# Patient Record
Sex: Female | Born: 1970 | Race: White | Hispanic: No | Marital: Married | Smoking: Never smoker
Health system: Southern US, Community
[De-identification: ages and names within clinical notes are randomized; demographics above are authoritative.]

## PROBLEM LIST (undated history)

## (undated) DIAGNOSIS — T753XXA Motion sickness, initial encounter: Secondary | ICD-10-CM

## (undated) DIAGNOSIS — I10 Essential (primary) hypertension: Secondary | ICD-10-CM

## (undated) HISTORY — PX: ABDOMINAL SURGERY: SHX537

## (undated) HISTORY — DX: Essential (primary) hypertension: I10

---

## 2005-09-28 DIAGNOSIS — G43909 Migraine, unspecified, not intractable, without status migrainosus: Secondary | ICD-10-CM | POA: Insufficient documentation

## 2008-04-16 ENCOUNTER — Ambulatory Visit: Payer: Self-pay | Admitting: Family Medicine

## 2009-05-10 IMAGING — CR RIGHT FOURTH TOE
1 series · 3 of 3 positions shown · non-contrast
Comparison: none

REASON FOR EXAM: pain s/p injury
COMMENTS:

PROCEDURE:     MDR - MDR TOE 4TH DIGIT RIGHT FOOT  - April 16, 2008 [DATE]
RESULT:     Images of the right fourth toe demonstrate no definite fracture,
dislocation or radiopaque foreign body.

[Series 1: view not recorded · 0.17mm/px · 3 of 3 slices shown]
[im 1/3]
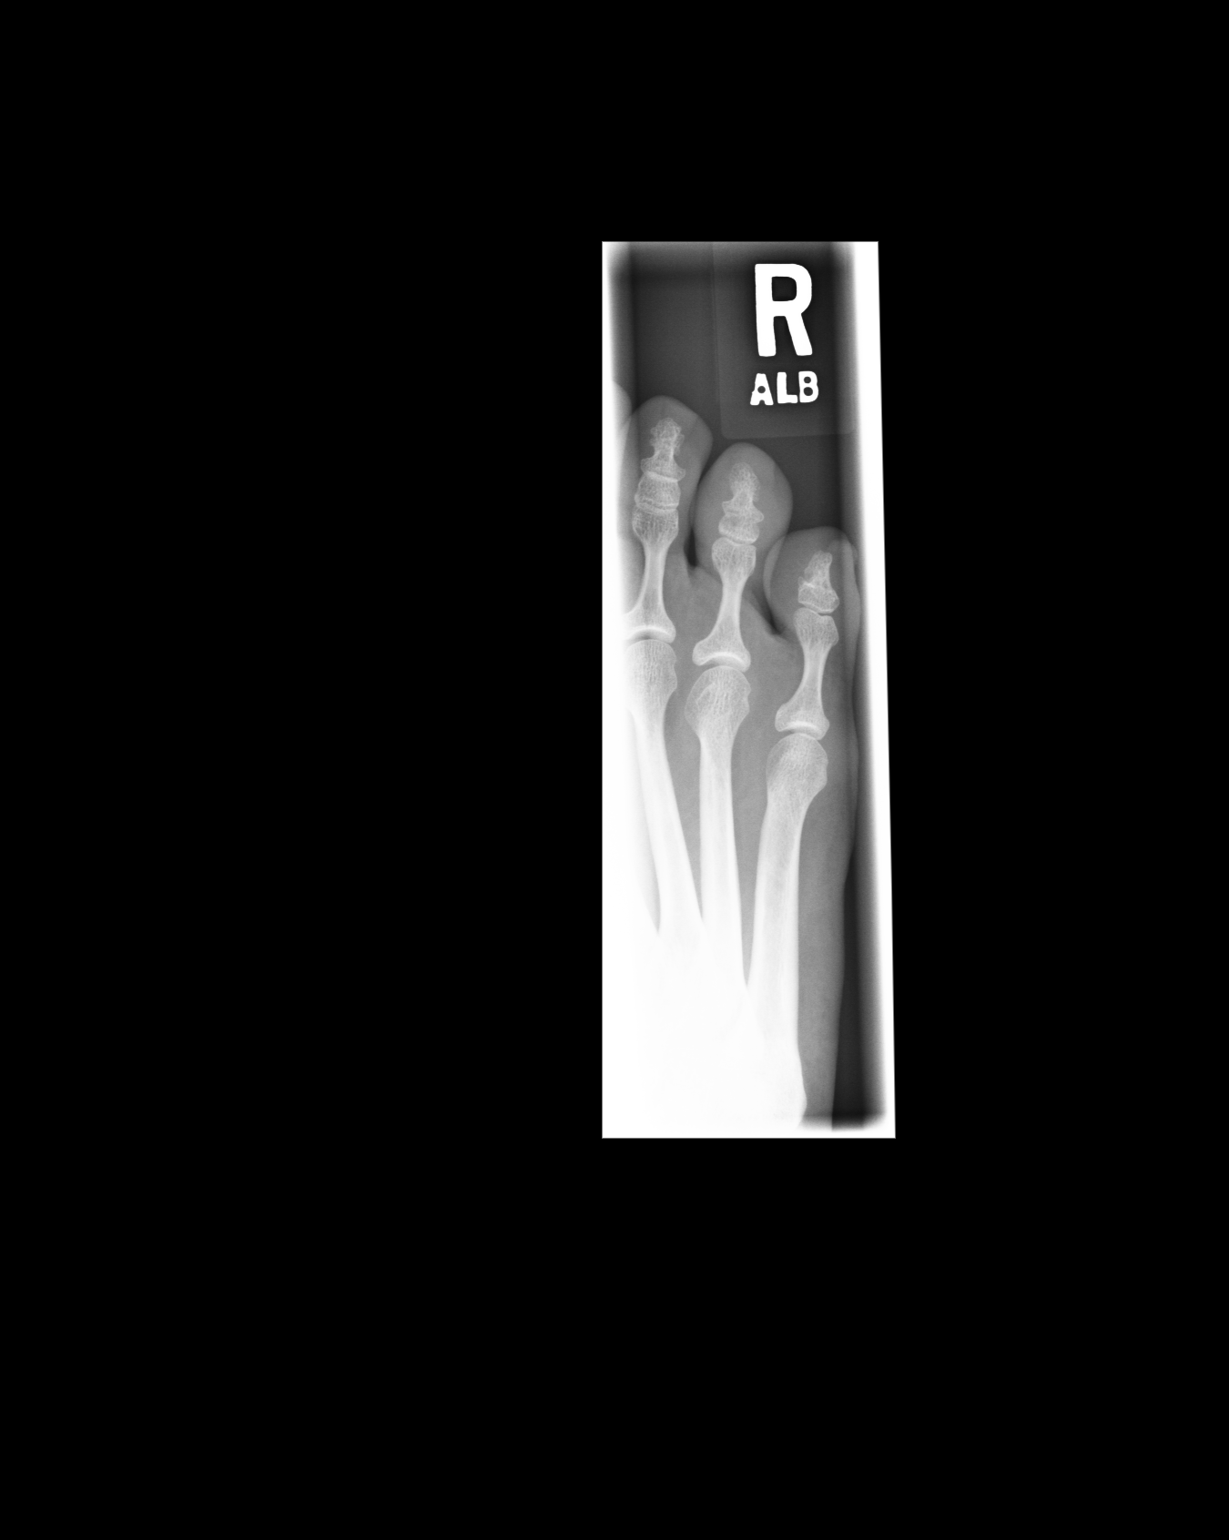
[im 2/3]
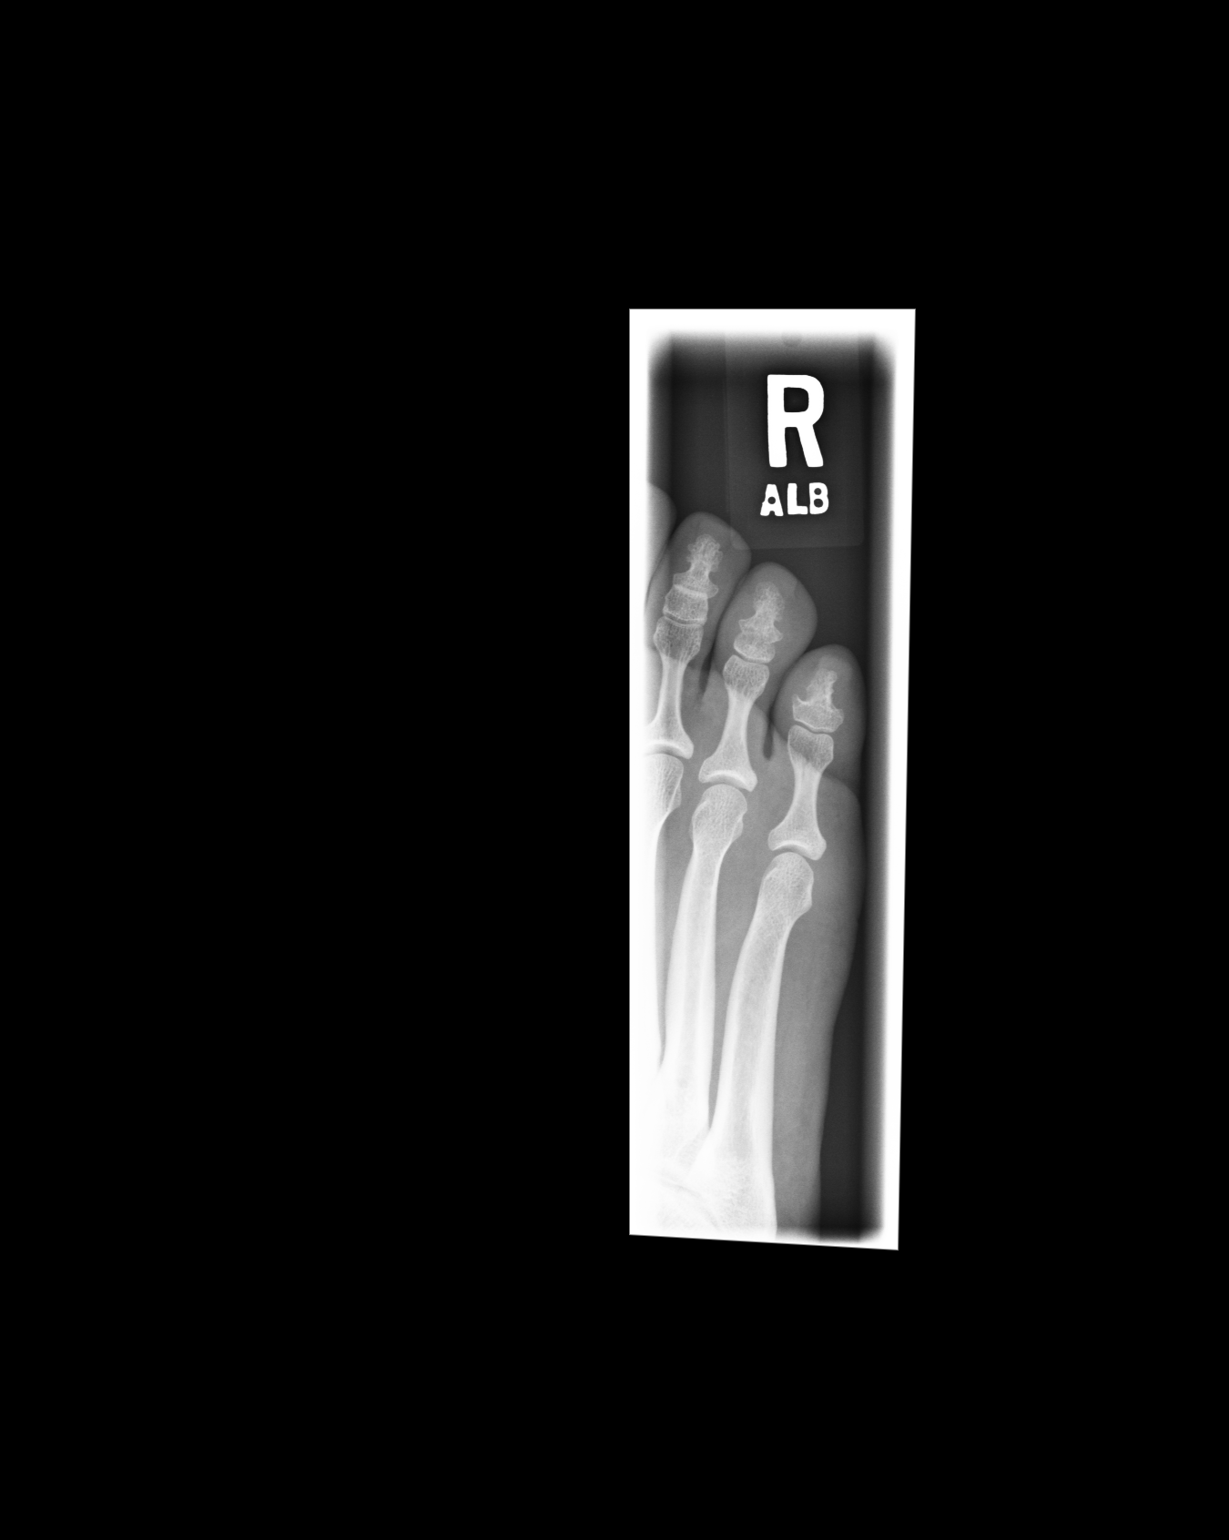
[im 3/3]
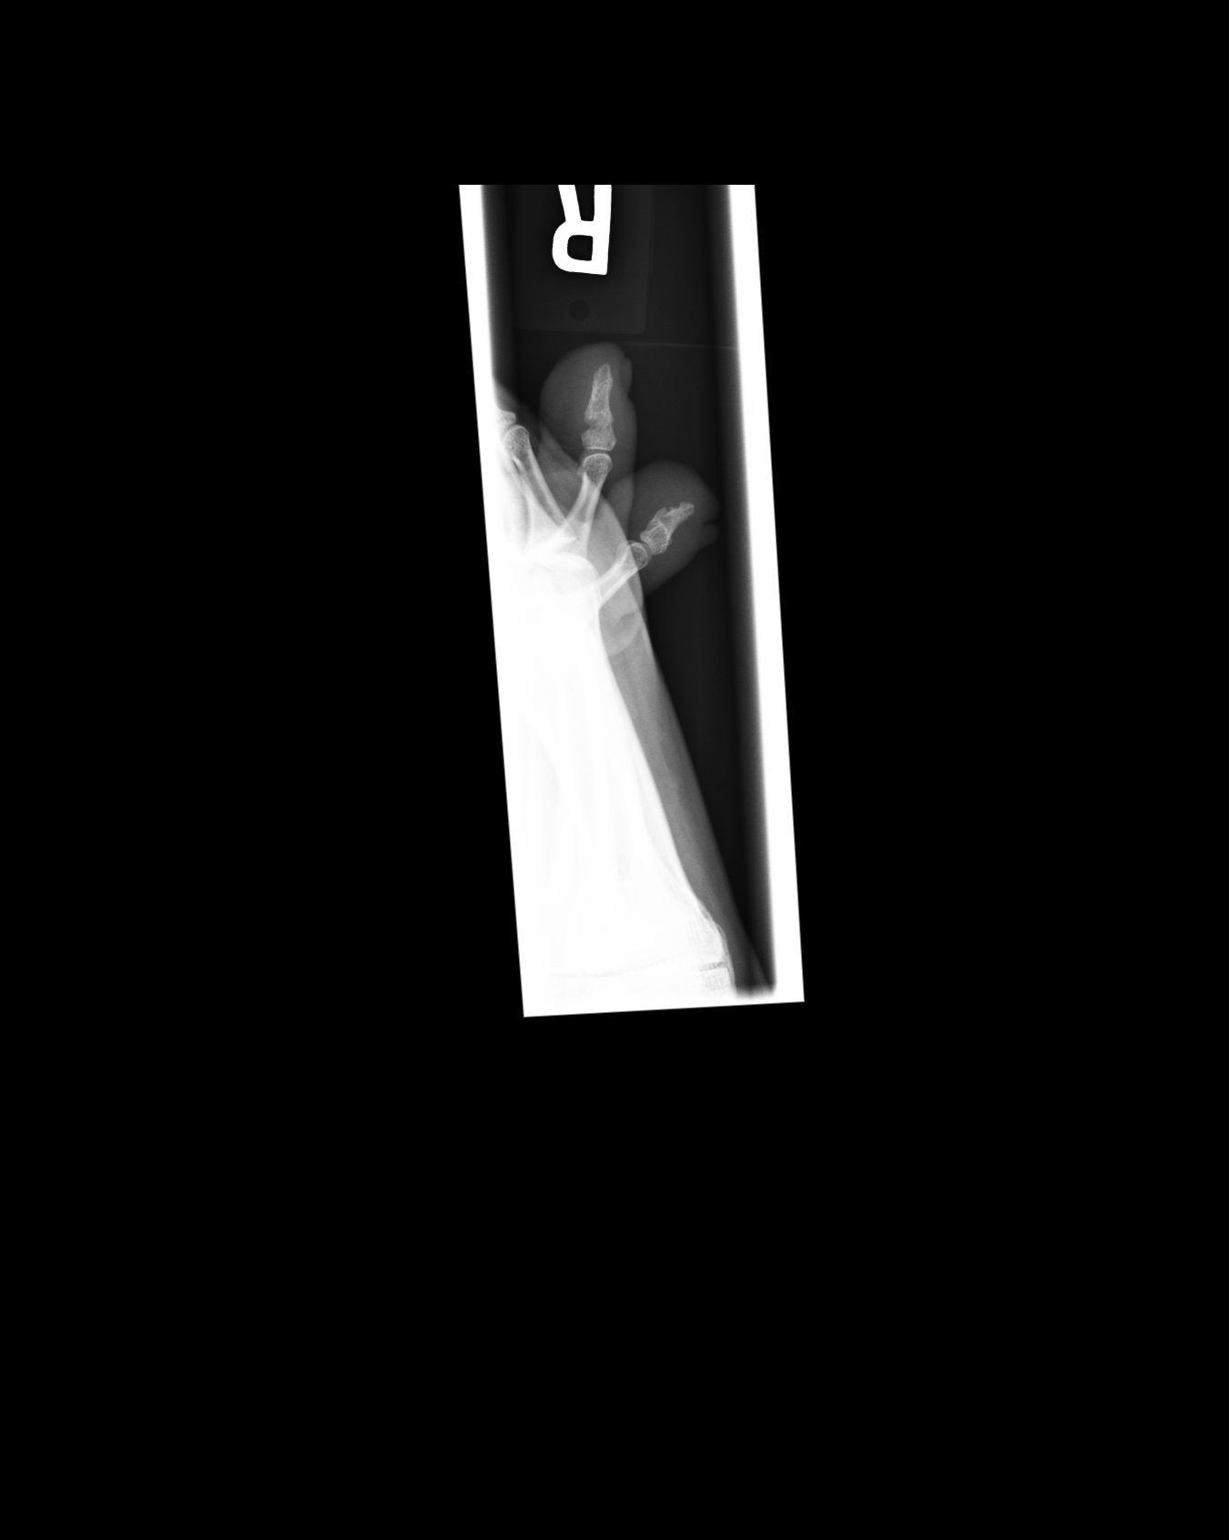

[3 of 3 positions shown; findings below may reference images not displayed]

IMPRESSION: No acute bony abnormality evident. If the patient has
worsening or persistent symptoms, followup imaging could be performed to
evaluate for possible occult fracture.

## 2011-04-02 ENCOUNTER — Ambulatory Visit: Payer: Self-pay | Admitting: Internal Medicine

## 2016-02-26 ENCOUNTER — Ambulatory Visit
Admission: EM | Admit: 2016-02-26 | Discharge: 2016-02-26 | Disposition: A | Discharge status: Home / Self Care | Payer: BC Managed Care – PPO | Attending: Family Medicine | Admitting: Family Medicine

## 2016-02-26 ENCOUNTER — Encounter: Payer: Self-pay | Admitting: Emergency Medicine

## 2016-02-26 DIAGNOSIS — J029 Acute pharyngitis, unspecified: Secondary | ICD-10-CM

## 2016-02-26 LAB — RAPID STREP SCREEN (MED CTR MEBANE ONLY): Streptococcus, Group A Screen (Direct): NEGATIVE

## 2016-02-26 MED ORDER — GUAIFENESIN-CODEINE 100-10 MG/5ML PO SOLN: ORAL | Status: DC

## 2016-02-26 MED ORDER — LIDOCAINE VISCOUS 2 % MT SOLN: OROMUCOSAL | Status: DC

## 2016-02-26 NOTE — Discharge Instructions (Signed)

## 2016-02-26 NOTE — ED Notes (Signed)
Patient c/o sore throat, cough, HAs  since Sunday.  Patient denies fevers.

## 2016-02-27 ENCOUNTER — Ambulatory Visit: Payer: Self-pay | Admitting: Family Medicine

## 2016-03-01 LAB — CULTURE, GROUP A STREP (THRC)

## 2016-03-05 ENCOUNTER — Telehealth (HOSPITAL_COMMUNITY): Payer: Self-pay | Admitting: Emergency Medicine

## 2016-03-05 NOTE — ED Notes (Signed)
LM on pt's VM [phone] Need to give lab results from recent visit on 5/31 Also let pt know labs can be obtained from [place]  Per Dr. Valere Dross,  Notes Recorded by Sherlene Shams, MD on 03/04/2016 at 1:44 AM Clinical staff, please let patient know that throat cx was negative for strep. LM

## 2016-04-17 NOTE — ED Provider Notes (Signed)
CSN: [id number]     Arrival date & time 02/26/16  1340 History   First MD Initiated Contact with Patient 02/26/16 1452     Chief Complaint  Patient presents with  . Sore Throat  . Cough   (Consider location/radiation/quality/duration/timing/severity/associated sxs/prior Treatment) Patient is a 45 y.o. female presenting with pharyngitis and cough. The history is provided by the patient.  Sore Throat This is a new problem. The current episode started more than 2 days ago. The problem occurs constantly. The problem has not changed since onset.Pertinent negatives include no chest pain, no abdominal pain, no headaches and no shortness of breath.  Cough Associated symptoms: no chest pain, no headaches and no shortness of breath     History reviewed. No pertinent past medical history. Past Surgical History  Procedure Laterality Date  . Abdominal surgery     History reviewed. No pertinent family history. Social History  Substance Use Topics  . Smoking status: Never Smoker   . Smokeless tobacco: None  . Alcohol Use: Yes   OB History    No data available     Review of Systems  Respiratory: Positive for cough. Negative for shortness of breath.   Cardiovascular: Negative for chest pain.  Gastrointestinal: Negative for abdominal pain.  Neurological: Negative for headaches.    Allergies  Penicillins  Home Medications   Prior to Admission medications   Medication Sig Start Date End Date Taking? Authorizing Provider  guaiFENesin-codeine 100-10 MG/5ML syrup 10 ml po qhs prn cough 02/26/16   Norval Gable, MD  lidocaine (XYLOCAINE) 2 % solution 20 ml gargle and spit q 6 hours prn sore throat 02/26/16   Norval Gable, MD   Meds Ordered and Administered this Visit  Medications - No data to display  BP 122/89 mmHg  Pulse 61  Temp(Src) 98.8 F (37.1 C) (Oral)  Resp 16  Ht 5\' 7"  (1.702 m)  Wt 145 lb (65.772 kg)  BMI 22.71 kg/m2  SpO2 100%  LMP 02/15/2016 (Exact Date) No data  found.   Physical Exam  Constitutional: She appears well-developed and well-nourished. No distress.  HENT:  Head: Normocephalic and atraumatic.  Right Ear: Tympanic membrane, external ear and ear canal normal.  Left Ear: Tympanic membrane, external ear and ear canal normal.  Nose: Mucosal edema and rhinorrhea present. No nose lacerations, sinus tenderness, nasal deformity, septal deviation or nasal septal hematoma. No epistaxis.  No foreign bodies.  Mouth/Throat: Uvula is midline and mucous membranes are normal. Posterior oropharyngeal erythema present. No oropharyngeal exudate or tonsillar abscesses.  Eyes: Conjunctivae and EOM are normal. Pupils are equal, round, and reactive to light. Right eye exhibits no discharge. Left eye exhibits no discharge. No scleral icterus.  Neck: Normal range of motion. Neck supple. No thyromegaly present.  Cardiovascular: Normal rate, regular rhythm and normal heart sounds.   Pulmonary/Chest: Effort normal and breath sounds normal. No respiratory distress. She has no wheezes. She has no rales.  Lymphadenopathy:    She has no cervical adenopathy.  Skin: She is not diaphoretic.  Nursing note and vitals reviewed.   ED Course  Procedures (including critical care time)  Labs Review Labs Reviewed  RAPID STREP SCREEN (NOT AT [facility])  CULTURE, GROUP A STREP [facility])    Imaging Review No results found.   Visual Acuity Review  Right Eye Distance:   Left Eye Distance:   Bilateral Distance:    Right Eye Near:   Left Eye Near:    Bilateral Near:  MDM   1. Pharyngitis    Discharge Medication List as of 02/26/2016  3:29 PM    START taking these medications   Details  guaiFENesin-codeine 100-10 MG/5ML syrup 10 ml po qhs prn cough, Print    lidocaine (XYLOCAINE) 2 % solution 20 ml gargle and spit q 6 hours prn sore throat, Normal       1. diagnosis reviewed with patient 2. rx as per orders above; reviewed possible side effects,  interactions, risks and benefits  3. Recommend supportive treatment with  4. Follow-up prn if symptoms worsen or don't improve    Norval Gable, MD 04/17/16 2146

## 2018-02-22 ENCOUNTER — Ambulatory Visit
Admission: EM | Admit: 2018-02-22 | Discharge: 2018-02-22 | Disposition: A | Discharge status: Home / Self Care | Payer: BC Managed Care – PPO | Attending: Nurse Practitioner | Admitting: Nurse Practitioner

## 2018-02-22 ENCOUNTER — Other Ambulatory Visit: Payer: Self-pay

## 2018-02-22 DIAGNOSIS — L255 Unspecified contact dermatitis due to plants, except food: Secondary | ICD-10-CM

## 2018-02-22 DIAGNOSIS — L237 Allergic contact dermatitis due to plants, except food: Secondary | ICD-10-CM

## 2018-02-22 MED ORDER — METHYLPREDNISOLONE SODIUM SUCC 40 MG IJ SOLR
80.0000 mg | Freq: Once | INTRAMUSCULAR | Status: AC
  Administered 2018-02-22: 80 mg via INTRAMUSCULAR

## 2018-02-22 MED ORDER — PREDNISONE 20 MG PO TABS: 40.0000 mg | ORAL_TABLET | Freq: Every day | ORAL | 0 refills | Status: AC

## 2018-02-22 NOTE — ED Provider Notes (Signed)
[facility]    CSN: [id number] Arrival date & time: 02/22/18  2355     History   Chief Complaint Chief Complaint  Patient presents with  . Poison Ivy    HPI Rebekah Vasquez is a 47 y.o. female.   History of Present Illness  Patient information was obtained from patient.  Rebekah Vasquez is a 47 y.o. Female that presents for evaluation of rash on abdomen, back, torso and trunk. Onset of symptoms was abrupt starting 4 days ago and has been gradually worsening since that time. Symptoms include itching. Care prior to arrival consisted of OTC ointment and benadryl with minimal relief.        History reviewed. No pertinent past medical history.  There are no active problems to display for this patient.   Past Surgical History:  Procedure Laterality Date  . ABDOMINAL SURGERY      OB History   None      Home Medications    Prior to Admission medications   Medication Sig Start Date End Date Taking? Authorizing Provider  guaiFENesin-codeine 100-10 MG/5ML syrup 10 ml po qhs prn cough 02/26/16   Norval Gable, MD  lidocaine (XYLOCAINE) 2 % solution 20 ml gargle and spit q 6 hours prn sore throat 02/26/16   Norval Gable, MD    Family History History reviewed. No pertinent family history.  Social History Social History   Tobacco Use  . Smoking status: Never Smoker  . Smokeless tobacco: Never Used  Substance Use Topics  . Alcohol use: Yes    Comment: social  . Drug use: Never     Allergies   Penicillins   Review of Systems Review of Systems  Skin: Positive for rash.  All other systems reviewed and are negative.    Physical Exam Triage Vital Signs ED Triage Vitals  Enc Vitals Group     BP 02/22/18 0823 138/85     Pulse Rate 02/22/18 0823 (!) 55     Resp 02/22/18 0823 16     Temp 02/22/18 0823 98.2 F (36.8 C)     Temp Source 02/22/18 0823 Oral     SpO2 02/22/18 0823 100 %     Weight 02/22/18 0824 155 lb (70.3 kg)     Height 02/22/18  0824 5\' 7"  (1.702 m)     Head Circumference --      Peak Flow --      Pain Score 02/22/18 0824 0     Pain Loc --      Pain Edu? --      Excl. in [place]? --    No data found.  Updated Vital Signs BP 138/85 (BP Location: Left Arm)   Pulse (!) 55   Temp 98.2 F (36.8 C) (Oral)   Resp 16   Ht 5\' 7"  (1.702 m)   Wt 155 lb (70.3 kg)   LMP 02/18/2018   SpO2 100%   BMI 24.28 kg/m   Visual Acuity Right Eye Distance:   Left Eye Distance:   Bilateral Distance:    Right Eye Near:   Left Eye Near:    Bilateral Near:     Physical Exam  Constitutional: She is oriented to person, place, and time. She appears well-developed and well-nourished.  Neck: Normal range of motion. Neck supple.  Cardiovascular: Normal rate and regular rhythm.  Pulmonary/Chest: Effort normal and breath sounds normal.  Musculoskeletal: Normal range of motion.  Neurological: She is alert and oriented to person, place,  and time.  Skin: Skin is warm and dry. Rash noted.  Psychiatric: She has a normal mood and affect.     UC Treatments / Results  Labs (all labs ordered are listed, but only abnormal results are displayed) Labs Reviewed - No data to display  EKG None  Radiology No results found.  Procedures Procedures (including critical care time)  Medications Ordered in UC Medications  methylPREDNISolone sodium succinate (SOLU-MEDROL) 40 mg/mL injection 80 mg (has no administration in time range)    Initial Impression / Assessment and Plan / UC Course  I have reviewed the triage vital signs and the nursing notes.  Pertinent labs & imaging results that were available during my care of the patient were reviewed by me and considered in my medical decision making (see chart for details).    47 yo female presenting with a 4-day history of progressively worsening rash secondary to poison ivy/oak.   Plan:  1.  Solu-Medrol 80 mg IM given in clinic 2.  Prednisone 40 mg p.o. daily x5 days 3.  Benadryl 25  mg p.o. every 6 x24 hours, then every 6 hours as needed 4.  Hydrocortisone cream as needed 5.  Avoid products with fragrances until symptoms resolve 6.  Follow up with PCP as needed  Evaluation has revealed no signs of a dangerous process. Discussed diagnosis with patient. Patient aware of their illness, possible red flag symptoms to watch out for and need for close follow up. Patient understands verbal and written discharge instructions. Patient comfortable with plan and disposition.  Patient a clear mental status at this time, good insight into illness (after discussion and teaching) and has clear judgment to make decisions regarding their care.  Documentation was completed with the aid of voice recognition software. Transcription may contain typographical errors.  Final Clinical Impressions(s) / UC Diagnoses   Final diagnoses:  Toxicodendron dermatitis   Discharge Instructions   None    ED Prescriptions    None     Controlled Substance Prescriptions [place] Controlled Substance Registry consulted? Not Applicable   Enrique Sack, [place] 02/22/18 [phone]

## 2018-02-22 NOTE — ED Triage Notes (Signed)
Pt reports poison ivy starting on Saturday. Mostly on trunk and hips but now spreading to arms

## 2018-11-10 ENCOUNTER — Ambulatory Visit: Payer: BC Managed Care – PPO | Admitting: Family Medicine

## 2018-11-10 ENCOUNTER — Encounter: Payer: Self-pay | Admitting: Family Medicine

## 2018-11-10 VITALS — BP 120/86 | HR 65 | Ht 67.0 in | Wt 149.0 lb

## 2018-11-10 DIAGNOSIS — R51 Headache: Secondary | ICD-10-CM

## 2018-11-10 DIAGNOSIS — Z7689 Persons encountering health services in other specified circumstances: Secondary | ICD-10-CM | POA: Diagnosis not present

## 2018-11-10 DIAGNOSIS — R29818 Other symptoms and signs involving the nervous system: Secondary | ICD-10-CM

## 2018-11-10 DIAGNOSIS — R030 Elevated blood-pressure reading, without diagnosis of hypertension: Secondary | ICD-10-CM | POA: Diagnosis not present

## 2018-11-10 DIAGNOSIS — R519 Headache, unspecified: Secondary | ICD-10-CM

## 2018-11-10 DIAGNOSIS — R0789 Other chest pain: Secondary | ICD-10-CM | POA: Diagnosis not present

## 2018-11-10 DIAGNOSIS — N943 Premenstrual tension syndrome: Secondary | ICD-10-CM | POA: Insufficient documentation

## 2018-11-10 NOTE — Progress Notes (Signed)
    Date:  11/10/2018   Name:  Rebekah Vasquez   DOB:  [birth year]   MRN:  [id number]   Chief Complaint: Establish Care and Hypertension (can't take HCTZ)  Hypertension  Associated symptoms include chest pain.  Chest Pain    Her past medical history is significant for hypertension.  Neurologic Problem  Associated symptoms include chest pain.    Review of Systems  Cardiovascular: Positive for chest pain.    Patient Active Problem List   Diagnosis Date Noted  . Premenstrual tension syndrome 11/10/2018  . Migraine 09/28/2005    Allergies  Allergen Reactions  . Penicillins Rash    Past Surgical History:  Procedure Laterality Date  . ABDOMINAL SURGERY      Social History   Tobacco Use  . Smoking status: Never Smoker  . Smokeless tobacco: Never Used  Substance Use Topics  . Alcohol use: Yes    Comment: social  . Drug use: Never     Medication list has been reviewed and updated.  Current Meds  Medication Sig  . ESTARYLLA 0.25-35 MG-MCG tablet TK 1 T PO QD  . FLUoxetine (PROZAC) 20 MG tablet Take 1 tablet by mouth daily.  . Ibuprofen (ADVIL) 200 MG CAPS Take 1 capsule by mouth as needed.    PHQ 2/9 Scores 11/10/2018  PHQ - 2 Score 0  PHQ- 9 Score 0    Physical Exam  BP 120/86   Pulse 65   Ht 5\' 7"  (1.702 m)   Wt 149 lb (67.6 kg)   SpO2 99%   BMI 23.34 kg/m   Assessment and Plan: 1. Establishing care with new doctor, encounter for Patient presents today to establish care with new physician.  2. Atypical chest pain Patient was seen in [facility] emergency room for chest pain.  This was atypical in nature and evaluation for pulmonary emboli and acute coronary event was ruled out.  Patient although was continuing to have chest discomfort with a heaviness to the right arm.  We will refer to cardiology for evaluation and possible stress test. - Ambulatory referral to Cardiology  3. Elevated blood pressure reading without diagnosis of hypertension Patient has  had waxing and waning of her blood pressure with elevated readings.  There is a family history of this she is reluctant to continue her hydrochlorothiazide because she does not feel well on it.  Reemphasized the importance of control of blood pressure and will have cardiology evaluate with possible resumption of some patient medication does have a history of migraines and is wondering whether or not use calcium channel blocker such as verapamil. - Ambulatory referral to Cardiology  4. Increased severity of headaches Patient has had an increase in headaches which she had a history of migraines.  There has been some concern because there are some other neurologic events associated with it such as slurring of speech and visual walk disturbance and right arm paresthesia.  Will refer to neurology for evaluation and treatment. - Ambulatory referral to Neurology  5. Focal neurological signs As noted above there is some focality associated with these headaches and this will be evaluated at the neurologic level. - Ambulatory referral to Neurology

## 2018-11-11 ENCOUNTER — Encounter

## 2018-11-11 DIAGNOSIS — I2089 Other forms of angina pectoris: Secondary | ICD-10-CM | POA: Insufficient documentation

## 2018-11-11 DIAGNOSIS — I208 Other forms of angina pectoris: Secondary | ICD-10-CM | POA: Insufficient documentation

## 2018-11-11 DIAGNOSIS — I10 Essential (primary) hypertension: Secondary | ICD-10-CM | POA: Insufficient documentation

## 2020-05-07 ENCOUNTER — Ambulatory Visit: Payer: Self-pay | Admitting: Family Medicine

## 2020-05-07 NOTE — Telephone Encounter (Signed)
noted 

## 2020-05-07 NOTE — Telephone Encounter (Signed)
Pt reports recent dental visit "BP was 190/100 at visit." Does not routinely check BP at home, did so today, prior to call 170/98, this AM 170/110.  BP taken with manual cuff at home. Reports has tried propranolol and HCTZ for hypertension, "Cannot tolerate" Is currently not taking any BP meds. States does feel "A little weird  in my chest., like anxiety." Denies CP, no SOB, no unilateral weakness, speech clear and non-halting.  Reports increased fatigue. Appt made for tomorrow with Dr. Ronnald Ramp. Care advise given per protocol.  Pt verbalizes understanding.    Reason for Disposition . Systolic BP  >= 785 OR Diastolic >= 885  Answer Assessment - Initial Assessment Questions 1. BLOOD PRESSURE: "What is the blood pressure?" "Did you take at least two measurements 5 minutes apart?"     170/98 few minutes ago. This AM 190/100   170/110 2. ONSET: "When did you take your blood pressure?"     Today 3. HOW: "How did you obtain the blood pressure?" (e.g., visiting nurse, automatic home BP monitor)     Manuel cuff at home 4. HISTORY: "Do you have a history of high blood pressure?"     yes 5. MEDICATIONS: "Are you taking any medications for blood pressure?" "Have you missed any doses recently?" Stopped taking HCTZ    6. OTHER SYMPTOMS: "Do you have any symptoms?" (e.g., headache, chest pain, blurred vision, difficulty breathing, weakness)     "Chest feels like anxiety tight."  Protocols used: BLOOD PRESSURE - HIGH-A-AH

## 2020-05-08 ENCOUNTER — Ambulatory Visit: Payer: BC Managed Care – PPO | Admitting: Family Medicine

## 2020-05-08 ENCOUNTER — Other Ambulatory Visit: Payer: Self-pay

## 2020-05-08 ENCOUNTER — Encounter: Payer: Self-pay | Admitting: Family Medicine

## 2020-05-08 VITALS — BP 160/98 | HR 66 | Ht 67.0 in | Wt 158.0 lb

## 2020-05-08 DIAGNOSIS — I10 Essential (primary) hypertension: Secondary | ICD-10-CM | POA: Diagnosis not present

## 2020-05-08 DIAGNOSIS — R079 Chest pain, unspecified: Secondary | ICD-10-CM

## 2020-05-08 DIAGNOSIS — R202 Paresthesia of skin: Secondary | ICD-10-CM | POA: Diagnosis not present

## 2020-05-08 MED ORDER — METOPROLOL SUCCINATE ER 25 MG PO TB24: 25.0000 mg | ORAL_TABLET | Freq: Every day | ORAL | 0 refills | Status: DC

## 2020-05-08 NOTE — Progress Notes (Signed)
Date:  05/08/2020   Name:  Rebekah Vasquez   DOB:  March 18, 1971   MRN:  [id number]   Chief Complaint: Hypertension (BP trending up at home per patient. She takes it manually and automatic. Highest 190/110. Lowest this morning was 162/90. Started having tingling in the right shoulder blade down into right arm last night. Has angina on and off- but she does have anxiety. Mother and sister both have HTN. )  Hypertension This is a chronic problem. The current episode started more than 1 year ago. The problem has been gradually worsening since onset. The problem is uncontrolled. Pertinent negatives include no anxiety, blurred vision, chest pain, headaches, malaise/fatigue, neck pain, orthopnea, palpitations, peripheral edema, PND, shortness of breath or sweats. There are no associated agents to hypertension. Past treatments include nothing (prior hctz/propranolol). The current treatment provides mild improvement. There are no compliance problems.  There is no history of angina, kidney disease, CAD/MI, CVA, heart failure, left ventricular hypertrophy, PVD or retinopathy. history stable angina. There is no history of chronic renal disease, a hypertension causing med or renovascular disease.  Chest Pain  This is a recurrent problem. The current episode started more than 1 year ago. The problem occurs daily. The problem has been waxing and waning. The pain is present in the substernal region. The pain is at a severity of 6/10. The pain is moderate. The quality of the pain is described as tightness. The pain radiates to the upper back and right shoulder. Pertinent negatives include no abdominal pain, cough, headaches, lower extremity edema, malaise/fatigue, near-syncope, orthopnea, palpitations, PND, shortness of breath or weakness.  Her past medical history is significant for hypertension.  Pertinent negatives for past medical history include no PVD.  Her family medical history is significant for CAD.  Neurologic  Problem The patient's pertinent negatives include no altered mental status, clumsiness, focal sensory loss, focal weakness, loss of balance, memory loss, near-syncope, slurred speech, syncope, visual change or weakness. Primary symptoms comment: paresthesia. This is a recurrent problem. The current episode started yesterday. The problem has been waxing and waning since onset. Pertinent negatives include no abdominal pain, auditory change, chest pain, headaches, neck pain, palpitations or shortness of breath. The treatment provided moderate relief.    No results found for: CREATININE, BUN, NA, K, CL, CO2 No results found for: CHOL, HDL, LDLCALC, LDLDIRECT, TRIG, CHOLHDL No results found for: TSH No results found for: HGBA1C No results found for: WBC, HGB, HCT, MCV, PLT No results found for: ALT, AST, GGT, ALKPHOS, BILITOT   Review of Systems  Constitutional: Negative for malaise/fatigue.  Eyes: Negative for blurred vision.  Respiratory: Negative for cough and shortness of breath.   Cardiovascular: Negative for chest pain, palpitations, orthopnea, PND and near-syncope.  Gastrointestinal: Negative for abdominal pain.  Musculoskeletal: Negative for neck pain.  Neurological: Negative for focal weakness, syncope, weakness, headaches and loss of balance.  Psychiatric/Behavioral: Negative for memory loss.    Patient Active Problem List   Diagnosis Date Noted  . Premenstrual tension syndrome 11/10/2018  . Migraine 09/28/2005    Allergies  Allergen Reactions  . Penicillins Rash    Past Surgical History:  Procedure Laterality Date  . ABDOMINAL SURGERY      Social History   Tobacco Use  . Smoking status: Never Smoker  . Smokeless tobacco: Never Used  Vaping Use  . Vaping Use: Never used  Substance Use Topics  . Alcohol use: Yes    Comment: social  . Drug  use: Never     Medication list has been reviewed and updated.  Current Meds  Medication Sig  . FLUoxetine (PROZAC) 20  MG tablet Take 1 tablet by mouth as needed.   . Ibuprofen (ADVIL) 200 MG CAPS Take 1 capsule by mouth as needed.  Donnetta Hail Estradiol (SPRINTEC 28 PO) Take by mouth. BRAND SPRINTEC ONLY    PHQ 2/9 Scores 05/08/2020 11/10/2018  PHQ - 2 Score 0 0  PHQ- 9 Score 3 0    GAD 7 : Generalized Anxiety Score 05/08/2020  Nervous, Anxious, on Edge 1  Control/stop worrying 0  Worry too much - different things 0  Trouble relaxing 0  Restless 0  Easily annoyed or irritable 1  Afraid - awful might happen 0  Total GAD 7 Score 2  Anxiety Difficulty Not difficult at all    BP Readings from Last 3 Encounters:  05/08/20 (!) 160/98  11/10/18 120/86  02/22/18 138/85    Physical Exam  Wt Readings from Last 3 Encounters:  05/08/20 158 lb (71.7 kg)  11/10/18 149 lb (67.6 kg)  02/22/18 155 lb (70.3 kg)    BP (!) 160/98   Pulse 66   Ht 5' 7" (1.702 m)   Wt 158 lb (71.7 kg)   LMP 04/14/2020 (Exact Date)   SpO2 100%   BMI 24.75 kg/m   Assessment and Plan:                                       Patient returns today after about a year for which that she has noted an increasing of her blood pressure.  She was seen by Dr. Earnie Larsson roughly a year ago.  Patient did not return for what sounds like a stress test and decided not to continue either of the hydrochlorothiazide that I started nor the propranolol that was initiated by Dr. Nehemiah Massed. 1. Essential hypertension Chronic.  Persistent.  Uncontrolled.  Patient returns today with elevated reading and a history of elevated readings by her daughter.  She is also been having some symptoms suggesting angina that she was seen before as well as some paresthesias when his arm without involvement of the lower extremity or face.  Patient was restarted on a beta-blocker but this time it was Toprol-XL 25 mg once a day and will recheck in 1 week to evaluate for tolerance of medication and results of blood pressure.  We also referred to cardiology for resumption  of her stable angina work-up.  And lipid panel was obtained to see if this will be also a risk factor that we will need to contemplate treating. - Ambulatory referral to Cardiology - Lipid Panel With LDL/HDL Ratio  2. Chest pain at rest Chronic.  Occurred over a year.  But recently been having substernal chest tightness radiating to the back and right shoulder.  Because of her uncontrolled blood pressure patient returns today for evaluation.  EKG was performed with the following results.I have reviewed EKG which shows rate 56 regular intervals normal.  No LVH criteria met for EKG.  No ischemic changes such as R waves delay, Q waves.  Nor ST-T wave changes.  Other than mild bradycardia patient was unremarkable.  Patient was referred again to Dr. Jose Persia to once again resume her work-up for stable angina.. Comparison to previous EKG dated none for comparison - EKG 12-Lead - Ambulatory referral to Cardiology  3. Paresthesia  New onset.  Episodic.  Stable.  Patient has had 1 or 2 episodes of paresthesias involving her left arm but not facial nor lower extremity.  There has been no focal weakness.  Cranial nerve and neurologic exam was unremarkable.  I think this may be more of a radiculopathy involving the cervical region.  We will continue to monitor this and will recheck patient in 1 week to see if this is progressed in any way.

## 2020-05-15 ENCOUNTER — Ambulatory Visit: Payer: BC Managed Care – PPO | Admitting: Family Medicine

## 2020-05-16 ENCOUNTER — Other Ambulatory Visit: Payer: Self-pay

## 2020-05-16 ENCOUNTER — Ambulatory Visit: Payer: BC Managed Care – PPO | Admitting: Family Medicine

## 2020-05-16 ENCOUNTER — Encounter: Payer: Self-pay | Admitting: Family Medicine

## 2020-05-16 VITALS — BP 168/100 | HR 59 | Ht 67.0 in | Wt 154.0 lb

## 2020-05-16 DIAGNOSIS — I10 Essential (primary) hypertension: Secondary | ICD-10-CM | POA: Diagnosis not present

## 2020-05-16 MED ORDER — METOPROLOL SUCCINATE ER 50 MG PO TB24: 50.0000 mg | ORAL_TABLET | Freq: Every day | ORAL | 0 refills | Status: DC

## 2020-05-16 NOTE — Patient Instructions (Signed)

## 2020-05-16 NOTE — Progress Notes (Signed)
Date:  05/16/2020   Name:  Rebekah Vasquez   DOB:  1971-09-03   MRN:  [id number]   Chief Complaint: Hypertension (Follow up with med change- metoprolol.)  Hypertension This is a new problem. The current episode started 1 to 4 weeks ago. The problem is unchanged. The problem is uncontrolled. Pertinent negatives include no anxiety, blurred vision, chest pain, headaches, malaise/fatigue, neck pain, orthopnea, palpitations, peripheral edema, PND, shortness of breath or sweats. Agents associated with hypertension include oral contraceptives. Past treatments include beta blockers. The current treatment provides no improvement. There is no history of angina, kidney disease, CAD/MI, CVA, heart failure, left ventricular hypertrophy, PVD or retinopathy. There is no history of chronic renal disease, a hypertension causing med or renovascular disease.    No results found for: CREATININE, BUN, NA, K, CL, CO2 No results found for: CHOL, HDL, LDLCALC, LDLDIRECT, TRIG, CHOLHDL No results found for: TSH No results found for: HGBA1C No results found for: WBC, HGB, HCT, MCV, PLT No results found for: ALT, AST, GGT, ALKPHOS, BILITOT   Review of Systems  Constitutional: Negative for chills, fever and malaise/fatigue.  HENT: Negative for drooling, ear discharge, ear pain and sore throat.   Eyes: Negative for blurred vision.  Respiratory: Negative for cough, shortness of breath and wheezing.   Cardiovascular: Negative for chest pain, palpitations, orthopnea, leg swelling and PND.  Gastrointestinal: Negative for abdominal pain, blood in stool, constipation, diarrhea and nausea.  Endocrine: Negative for polydipsia.  Genitourinary: Negative for dysuria, frequency, hematuria and urgency.  Musculoskeletal: Negative for back pain, myalgias and neck pain.  Skin: Negative for rash.  Allergic/Immunologic: Negative for environmental allergies.  Neurological: Negative for dizziness and headaches.  Hematological: Does  not bruise/bleed easily.  Psychiatric/Behavioral: Negative for suicidal ideas. The patient is not nervous/anxious.     Patient Active Problem List   Diagnosis Date Noted  . Benign essential HTN 11/11/2018  . Stable angina pectoris ([place]) 11/11/2018  . Premenstrual tension syndrome 11/10/2018  . Migraine 09/28/2005    Allergies  Allergen Reactions  . Penicillins Rash    Past Surgical History:  Procedure Laterality Date  . ABDOMINAL SURGERY      Social History   Tobacco Use  . Smoking status: Never Smoker  . Smokeless tobacco: Never Used  Vaping Use  . Vaping Use: Never used  Substance Use Topics  . Alcohol use: Yes    Comment: social  . Drug use: Never     Medication list has been reviewed and updated.  Current Meds  Medication Sig  . FLUoxetine (PROZAC) 20 MG tablet Take 1 tablet by mouth as needed.   . Ibuprofen (ADVIL) 200 MG CAPS Take 1 capsule by mouth as needed.  . metoprolol succinate (TOPROL-XL) 25 MG 24 hr tablet Take 1 tablet (25 mg total) by mouth daily.  Donnetta Hail Estradiol (SPRINTEC 28 PO) Take by mouth. BRAND SPRINTEC ONLY    PHQ 2/9 Scores 05/08/2020 11/10/2018  PHQ - 2 Score 0 0  PHQ- 9 Score 3 0    GAD 7 : Generalized Anxiety Score 05/08/2020  Nervous, Anxious, on Edge 1  Control/stop worrying 0  Worry too much - different things 0  Trouble relaxing 0  Restless 0  Easily annoyed or irritable 1  Afraid - awful might happen 0  Total GAD 7 Score 2  Anxiety Difficulty Not difficult at all    BP Readings from Last 3 Encounters:  05/16/20 (!) 168/100  05/08/20 (!) 160/98  11/10/18 120/86    Physical Exam Vitals and nursing note reviewed.  Constitutional:      Appearance: She is well-developed.  HENT:     Head: Normocephalic.     Right Ear: External ear normal.     Left Ear: External ear normal.  Eyes:     General: Lids are everted, no foreign bodies appreciated. No scleral icterus.       Left eye: No foreign body or  hordeolum.     Conjunctiva/sclera: Conjunctivae normal.     Right eye: Right conjunctiva is not injected.     Left eye: Left conjunctiva is not injected.     Pupils: Pupils are equal, round, and reactive to light.  Neck:     Thyroid: No thyromegaly.     Vascular: No JVD.     Trachea: No tracheal deviation.  Cardiovascular:     Rate and Rhythm: Normal rate and regular rhythm.     Heart sounds: Normal heart sounds. No murmur heard.  No friction rub. No gallop.   Pulmonary:     Effort: Pulmonary effort is normal. No respiratory distress.     Breath sounds: Normal breath sounds. No wheezing or rales.  Abdominal:     General: Bowel sounds are normal.     Palpations: Abdomen is soft. There is no mass.     Tenderness: There is no abdominal tenderness. There is no guarding or rebound.  Musculoskeletal:        General: No tenderness. Normal range of motion.     Cervical back: Normal range of motion and neck supple.  Lymphadenopathy:     Cervical: No cervical adenopathy.  Skin:    General: Skin is warm.     Findings: No rash.  Neurological:     Mental Status: She is alert and oriented to person, place, and time.     Cranial Nerves: No cranial nerve deficit.     Deep Tendon Reflexes: Reflexes normal.  Psychiatric:        Mood and Affect: Mood is not anxious or depressed.     Wt Readings from Last 3 Encounters:  05/16/20 154 lb (69.9 kg)  05/08/20 158 lb (71.7 kg)  11/10/18 149 lb (67.6 kg)    BP (!) 168/100   Pulse (!) 59   Ht 5\' 7"  (1.702 m)   Wt 154 lb (69.9 kg)   SpO2 99%   BMI 24.12 kg/m   Assessment and Plan: 1. Essential hypertension New onset.  Uncontrolled.  Asymptomatic.  Patient continues to have elevation of her blood pressure on the low dosage of metoprolol of 25 mg XL.  This was selected low purposely because patient has a long history of medication intolerance.  Because of her history of migraines we used a beta-blocker which may also help in reducing the  incidence of migraines.  Patient has tolerated the 25 mg XL but has not had an appreciable decrease in her blood pressure.  Today she will take a second 25 mg XL and see how she tolerates it and if she is done well with this double dosing she has been given a printed prescription for 50 XL to begin tomorrow.  Otherwise she will call if there is an issue and we will continue just with the 25 to she can see cardiology for determinations. - metoprolol succinate (TOPROL-XL) 50 MG 24 hr tablet; Take 1 tablet (50 mg total) by mouth daily. Take with or immediately following a meal.  Dispense: 30 tablet;  Refill: 0

## 2020-05-17 LAB — LIPID PANEL WITH LDL/HDL RATIO
Cholesterol, Total: 212 mg/dL — ABNORMAL HIGH (ref 100–199)
HDL: 61 mg/dL (ref >39)
LDL Chol Calc (NIH): 132 mg/dL — ABNORMAL HIGH (ref 0–99)
LDL/HDL Ratio: 2.2 ratio (ref 0.0–3.2)
Triglycerides: 109 mg/dL (ref 0–149)
VLDL Cholesterol Cal: 19 mg/dL (ref 5–40)

## 2020-06-08 ENCOUNTER — Other Ambulatory Visit: Payer: Self-pay | Admitting: Family Medicine

## 2020-06-08 DIAGNOSIS — I10 Essential (primary) hypertension: Secondary | ICD-10-CM

## 2020-06-08 NOTE — Telephone Encounter (Signed)
Requested Prescriptions  Pending Prescriptions Disp Refills   metoprolol succinate (TOPROL-XL) 50 MG 24 hr tablet [Pharmacy Med Name: METOPROLOL SUCC ER 50 MG TAB] 90 tablet 1    Sig: TAKE 1 TABLET BY MOUTH DAILY WITH OR IMMEDIATELY FOLLOWING A MEAL     Cardiovascular:  Beta Blockers Failed - 06/08/2020  9:32 AM      Failed - Last BP in normal range    BP Readings from Last 1 Encounters:  05/16/20 (!) 168/100         Passed - Last Heart Rate in normal range    Pulse Readings from Last 1 Encounters:  05/16/20 (!) 59         Passed - Valid encounter within last 6 months    Recent Outpatient Visits          3 weeks ago Essential hypertension   [place], [place]   1 month ago Essential hypertension   [facility] Juline Patch, MD   1 year ago Establishing care with new doctor, encounter for   [facility] Juline Patch, MD

## 2020-07-16 ENCOUNTER — Ambulatory Visit: Payer: Self-pay | Admitting: *Deleted

## 2020-07-16 NOTE — Telephone Encounter (Signed)
Patient calling to request appt due to more frequent migraine headaches. No symptoms of headache at this time. Mild vertigo this am. Last headache yesterday. Noticed aura and then blurred vision, speech was affected and became confused or felt "weird" approx. 1-2 hours and symptoms went away. Denies numbness or weakness, nausea or vomiting. Reports migraines or headache started 16 years ago, then every other year, this year in June with speech affected, September, vertigo, speech, and confusion. Yesterday aura, blurred vision, garbled speech, thought process affected and vertigo. appt made for 07/18/20 as requested. Instructed patient to call 911 or go to ED if any symptoms are noted again. Patient reports she now has B/P regulated. No value given. Encouraged patient to keep record of B/P and episodes of headaches and symptoms. Care advise given. Patient verbalized understanding of care advise and to call back or go to ED or call 911.    Reason for Disposition . Headache is a chronic symptom (recurrent or ongoing AND present > 4 weeks)  Answer Assessment - Initial Assessment Questions 1. LOCATION: "Where does it hurt?"      head 2. ONSET: "When did the headache start?" (Minutes, hours or days)      Migraines started over 16 years ago . No headache at this time  3. PATTERN: "Does the pain come and go, or has it been constant since it started?"     Comes and goes . Started 16 years ago , then every other year, and this year noted in June and September, and yesterday  4. SEVERITY: "How bad is the pain?" and "What does it keep you from doing?"  (e.g., Scale 1-10; mild, moderate, or severe)   - MILD (1-3): doesn't interfere with normal activities    - MODERATE (4-7): interferes with normal activities or awakens from sleep    - SEVERE (8-10): excruciating pain, unable to do any normal activities        severe 5. RECURRENT SYMPTOM: "Have you ever had headaches before?" If Yes, ask: "When was the last  time?" and "What happened that time?"      Yes . Yesterday had to lay down in quiet and dark place  6. CAUSE: "What do you think is causing the headache?"     Not sure  7. MIGRAINE: "Have you been diagnosed with migraine headaches?" If Yes, ask: "Is this headache similar?"      Yes  8. HEAD INJURY: "Has there been any recent injury to the head?"      na 9. OTHER SYMPTOMS: "Do you have any other symptoms?" (fever, stiff neck, eye pain, sore throat, cold symptoms)     Aura ,blurred vision, confusion, speech is affected  10. PREGNANCY: "Is there any chance you are pregnant?" "When was your last menstrual period?"       na  Protocols used: HEADACHE-A-AH

## 2020-07-18 ENCOUNTER — Ambulatory Visit: Payer: BC Managed Care – PPO | Admitting: Family Medicine

## 2020-12-30 ENCOUNTER — Other Ambulatory Visit: Payer: Self-pay | Admitting: Family Medicine

## 2020-12-30 DIAGNOSIS — I10 Essential (primary) hypertension: Secondary | ICD-10-CM

## 2021-01-02 ENCOUNTER — Encounter: Payer: Self-pay | Admitting: Family Medicine

## 2021-01-02 ENCOUNTER — Other Ambulatory Visit: Payer: Self-pay

## 2021-01-02 ENCOUNTER — Ambulatory Visit: Payer: BC Managed Care – PPO | Admitting: Family Medicine

## 2021-01-02 VITALS — BP 122/80 | HR 80 | Ht 67.0 in | Wt 161.0 lb

## 2021-01-02 DIAGNOSIS — I10 Essential (primary) hypertension: Secondary | ICD-10-CM

## 2021-01-02 DIAGNOSIS — B351 Tinea unguium: Secondary | ICD-10-CM

## 2021-01-02 MED ORDER — AMLODIPINE BESYLATE 5 MG PO TABS: 1.0000 | ORAL_TABLET | Freq: Every day | ORAL | 11 refills | Status: DC

## 2021-01-02 MED ORDER — METOPROLOL SUCCINATE ER 25 MG PO TB24: 25.0000 mg | ORAL_TABLET | Freq: Every day | ORAL | 1 refills | Status: DC

## 2021-01-02 NOTE — Progress Notes (Signed)
Date:  01/02/2021   Name:  Rebekah Vasquez   DOB:  [birth year]   MRN:  [id number]   Chief Complaint: Hypertension and Nail Problem (Wants referral)  Hypertension This is a chronic problem. The current episode started more than 1 year ago. The problem has been gradually improving since onset. The problem is controlled. Pertinent negatives include no anxiety, blurred vision, chest pain, headaches, malaise/fatigue, neck pain, orthopnea, palpitations, peripheral edema, PND, shortness of breath or sweats. There are no associated agents to hypertension. Past treatments include calcium channel blockers and beta blockers. The current treatment provides moderate improvement. There are no compliance problems.  There is no history of angina, kidney disease, CAD/MI, CVA, heart failure, left ventricular hypertrophy, PVD or retinopathy. There is no history of chronic renal disease, a hypertension causing med or renovascular disease.    No results found for: CREATININE, BUN, NA, K, CL, CO2 Lab Results  Component Value Date   CHOL 212 (H) 05/16/2020   HDL 61 05/16/2020   LDLCALC 132 (H) 05/16/2020   TRIG 109 05/16/2020   No results found for: TSH No results found for: HGBA1C No results found for: WBC, HGB, HCT, MCV, PLT No results found for: ALT, AST, GGT, ALKPHOS, BILITOT   Review of Systems  Constitutional: Negative.  Negative for chills, fatigue, fever, malaise/fatigue and unexpected weight change.  HENT: Negative for congestion, ear discharge, ear pain, rhinorrhea, sinus pressure, sneezing and sore throat.   Eyes: Negative for blurred vision, photophobia, pain, discharge, redness and itching.  Respiratory: Negative for cough, shortness of breath, wheezing and stridor.   Cardiovascular: Negative for chest pain, palpitations, orthopnea and PND.  Gastrointestinal: Negative for abdominal pain, blood in stool, constipation, diarrhea, nausea and vomiting.  Endocrine: Negative for cold intolerance, heat  intolerance, polydipsia, polyphagia and polyuria.  Genitourinary: Negative for dysuria, flank pain, frequency, hematuria, menstrual problem, pelvic pain, urgency, vaginal bleeding and vaginal discharge.  Musculoskeletal: Negative for arthralgias, back pain, myalgias and neck pain.  Skin: Negative for rash.  Allergic/Immunologic: Negative for environmental allergies and food allergies.  Neurological: Negative for dizziness, weakness, light-headedness, numbness and headaches.  Hematological: Negative for adenopathy. Does not bruise/bleed easily.  Psychiatric/Behavioral: Negative for dysphoric mood. The patient is not nervous/anxious.     Patient Active Problem List   Diagnosis Date Noted  . Benign essential HTN 11/11/2018  . Stable angina pectoris ([place]) 11/11/2018  . Premenstrual tension syndrome 11/10/2018  . Migraine 09/28/2005    Allergies  Allergen Reactions  . Penicillins Rash    Past Surgical History:  Procedure Laterality Date  . ABDOMINAL SURGERY      Social History   Tobacco Use  . Smoking status: Never Smoker  . Smokeless tobacco: Never Used  Vaping Use  . Vaping Use: Never used  Substance Use Topics  . Alcohol use: Yes    Comment: social  . Drug use: Never     Medication list has been reviewed and updated.  Current Meds  Medication Sig  . amLODipine (NORVASC) 5 MG tablet Take 1 tablet by mouth daily. kowalski  . Ibuprofen 200 MG CAPS Take 1 capsule by mouth as needed.  . metoprolol succinate (TOPROL-XL) 25 MG 24 hr tablet Take 1 tablet (25 mg total) by mouth daily. (Patient taking differently: Take 25 mg by mouth daily. kowalski)  . [DISCONTINUED] FLUoxetine (PROZAC) 20 MG tablet Take 1 tablet by mouth as needed.   . [DISCONTINUED] Norgestimate-Eth Estradiol (SPRINTEC 28 PO) Take by mouth. [facility] SPRINTEC  ONLY    PHQ 2/9 Scores 05/08/2020 11/10/2018  PHQ - 2 Score 0 0  PHQ- 9 Score 3 0    GAD 7 : Generalized Anxiety Score 05/08/2020  Nervous, Anxious,  on Edge 1  Control/stop worrying 0  Worry too much - different things 0  Trouble relaxing 0  Restless 0  Easily annoyed or irritable 1  Afraid - awful might happen 0  Total GAD 7 Score 2  Anxiety Difficulty Not difficult at all    BP Readings from Last 3 Encounters:  01/02/21 122/80  05/16/20 (!) 168/100  05/08/20 (!) 160/98    Physical Exam Vitals and nursing note reviewed.  Constitutional:      General: She is not in acute distress.    Appearance: She is not diaphoretic.  HENT:     Head: Normocephalic and atraumatic.     Right Ear: Tympanic membrane, ear canal and external ear normal.     Left Ear: Tympanic membrane, ear canal and external ear normal.     Nose: Nose normal. No congestion or rhinorrhea.     Mouth/Throat:     Mouth: Mucous membranes are moist.  Eyes:     General:        Right eye: No discharge.        Left eye: No discharge.     Conjunctiva/sclera: Conjunctivae normal.     Pupils: Pupils are equal, round, and reactive to light.  Neck:     Thyroid: No thyromegaly.     Vascular: No JVD.  Cardiovascular:     Rate and Rhythm: Normal rate and regular rhythm.     Heart sounds: Normal heart sounds. No murmur heard. No friction rub. No gallop.   Pulmonary:     Effort: Pulmonary effort is normal.     Breath sounds: Normal breath sounds. No wheezing or rhonchi.  Abdominal:     General: Bowel sounds are normal.     Palpations: Abdomen is soft. There is no mass.     Tenderness: There is no abdominal tenderness. There is no guarding.  Musculoskeletal:        General: Normal range of motion.     Cervical back: Normal range of motion and neck supple.  Lymphadenopathy:     Cervical: No cervical adenopathy.  Skin:    General: Skin is warm and dry.  Neurological:     Mental Status: She is alert.     Deep Tendon Reflexes: Reflexes are normal and symmetric.     Wt Readings from Last 3 Encounters:  01/02/21 161 lb (73 kg)  05/16/20 154 lb (69.9 kg)   05/08/20 158 lb (71.7 kg)    BP 122/80   Pulse 80   Ht 5\' 7"  (1.702 m)   Wt 161 lb (73 kg)   BMI 25.22 kg/m   Assessment and Plan: 1. Essential hypertension Chronic.  Controlled.  Stable.  Blood pressure today is 122/80.  We will continue metoprolol XL 25 mg once a day rather than a half of a 50 mg tablet and amlodipine 5 mg once a day. - metoprolol succinate (TOPROL-XL) 25 MG 24 hr tablet; Take 1 tablet (25 mg total) by mouth daily. kowalski  Dispense: 90 tablet; Refill: 1 - amLODipine (NORVASC) 5 MG tablet; Take 1 tablet (5 mg total) by mouth daily. kowalski  Dispense: 30 tablet; Refill: 11  2. Onychomycosis of left great toe New onset.  Persistent.  Stable.  Patient has a discolored and thickened nail of  the left great toe.  Patient would like to have a dermatology consult for evaluation and if possible treatment thereof.  Referral has been made to dermatology. - Ambulatory referral to Dermatology

## 2021-01-02 NOTE — Patient Instructions (Addendum)
Fungal Nail Infection A fungal nail infection is a common infection of the toenails or fingernails. This condition affects toenails more often than fingernails. It often affects the great, or big, toes. More than one nail may be infected. The condition can be passed from person to person (is contagious). What are the causes? This condition is caused by a fungus. Several types of fungi can cause the infection. These fungi are common in moist and warm areas. If your hands or feet come into contact with the fungus, it may get into a crack in your fingernail or toenail and cause the infection. What increases the risk? The following factors may make you more likely to develop this condition:  Being female.  Being of older age.  Living with someone who has the fungus.  Walking barefoot in areas where the fungus thrives, such as showers or locker rooms.  Wearing shoes and socks that cause your feet to sweat.  Having a nail injury or a recent nail surgery.  Having certain medical conditions, such as: ? Athlete's foot. ? Diabetes. ? Psoriasis. ? Poor circulation. ? A weak body defense system (immune system). What are the signs or symptoms? Symptoms of this condition include:  A pale spot on the nail.  Thickening of the nail.  A nail that becomes yellow or brown.  A brittle or ragged nail edge.  A crumbling nail.  A nail that has lifted away from the nail bed.   How is this diagnosed? This condition is diagnosed with a physical exam. Your health care provider may take a scraping or clipping from your nail to test for the fungus. How is this treated? Treatment is not needed for mild infections. If you have significant nail changes, treatment may include:  Antifungal medicines taken by mouth (orally). You may need to take the medicine for several weeks or several months, and you may not see the results for a long time. These medicines can cause side effects. Ask your health care  provider what problems to watch for.  Antifungal nail polish or nail cream. These may be used along with oral antifungal medicines.  Laser treatment of the nail.  Surgery to remove the nail. This may be needed for the most severe infections. It can take a long time, usually up to a year, for the infection to go away. The infection may also come back.   Follow these instructions at home: Medicines  Take or apply over-the-counter and prescription medicines only as told by your health care provider.  Ask your health care provider about using over-the-counter mentholated ointment on your nails. Nail care  Trim your nails often.  Wash and dry your hands and feet every day.  Keep your feet dry: ? Wear absorbent socks, and change your socks frequently. ? Wear shoes that allow air to circulate, such as sandals or canvas tennis shoes. Throw out old shoes.  Do not use artificial nails.  If you go to a nail salon, make sure you choose one that uses clean instruments.  Use antifungal foot powder on your feet and in your shoes. General instructions  Do not share personal items, such as towels or nail clippers.  Do not walk barefoot in shower rooms or locker rooms.  Wear rubber gloves if you are working with your hands in wet areas.  Keep all follow-up visits as told by your health care provider. This is important. Contact a health care provider if: Your infection is not getting better or   it is getting worse after several months. Summary  A fungal nail infection is a common infection of the toenails or fingernails.  Treatment is not needed for mild infections. If you have significant nail changes, treatment may include taking medicine orally and applying medicine to your nails.  It can take a long time, usually up to a year, for the infection to go away. The infection may also come back.  Take or apply over-the-counter and prescription medicines only as told by your health care  provider.  Follow instructions for taking care of your nails to help prevent infection from coming back or spreading. This information is not intended to replace advice given to you by your health care provider. Make sure you discuss any questions you have with your health care provider. Document Revised: 01/05/2019 Document Reviewed: 02/18/2018 Elsevier Patient Education  2021 [place].  Sleep Apnea Sleep apnea is a condition in which breathing pauses or becomes shallow during sleep. Episodes of sleep apnea usually last 10 seconds or longer, and they may occur as many as 20 times an hour. Sleep apnea disrupts your sleep and keeps your body from getting the rest that it needs. This condition can increase your risk of certain health problems, including:  Heart attack.  Stroke.  Obesity.  Diabetes.  Heart failure.  Irregular heartbeat. What are the causes? There are three kinds of sleep apnea:  Obstructive sleep apnea. This kind is caused by a blocked or collapsed airway.  Central sleep apnea. This kind happens when the part of the brain that controls breathing does not send the correct signals to the muscles that control breathing.  Mixed sleep apnea. This is a combination of obstructive and central sleep apnea. The most common cause of this condition is a collapsed or blocked airway. An airway can collapse or become blocked if:  Your throat muscles are abnormally relaxed.  Your tongue and tonsils are larger than normal.  You are overweight.  Your airway is smaller than normal.   What increases the risk? You are more likely to develop this condition if you:  Are overweight.  Smoke.  Have a smaller than normal airway.  Are elderly.  Are female.  Drink alcohol.  Take sedatives or tranquilizers.  Have a family history of sleep apnea. What are the signs or symptoms? Symptoms of this condition include:  Trouble staying asleep.  Daytime sleepiness and  tiredness.  Irritability.  Loud snoring.  Morning headaches.  Trouble concentrating.  Forgetfulness.  Decreased interest in sex.  Unexplained sleepiness.  Mood swings.  Personality changes.  Feelings of depression.  Waking up often during the night to urinate.  Dry mouth.  Sore throat. How is this diagnosed? This condition may be diagnosed with:  A medical history.  A physical exam.  A series of tests that are done while you are sleeping (sleep study). These tests are usually done in a sleep lab, but they may also be done at home. How is this treated? Treatment for this condition aims to restore normal breathing and to ease symptoms during sleep. It may involve managing health issues that can affect breathing, such as high blood pressure or obesity. Treatment may include:  Sleeping on your side.  Using a decongestant if you have nasal congestion.  Avoiding the use of depressants, including alcohol, sedatives, and narcotics.  Losing weight if you are overweight.  Making changes to your diet.  Quitting smoking.  Using a device to open your airway while you  sleep, such as: ? An oral appliance. This is a custom-made mouthpiece that shifts your lower jaw forward. ? A continuous positive airway pressure (CPAP) device. This device blows air through a mask when you breathe out (exhale). ? A nasal expiratory positive airway pressure (EPAP) device. This device has valves that you put into each nostril. ? A bi-level positive airway pressure (BPAP) device. This device blows air through a mask when you breathe in (inhale) and breathe out (exhale).  Having surgery if other treatments do not work. During surgery, excess tissue is removed to create a wider airway. It is important to get treatment for sleep apnea. Without treatment, this condition can lead to:  High blood pressure.  Coronary artery disease.  In men, an inability to achieve or maintain an erection  (impotence).  Reduced thinking abilities.   Follow these instructions at home: Lifestyle  Make any lifestyle changes that your health care provider recommends.  Eat a healthy, well-balanced diet.  Take steps to lose weight if you are overweight.  Avoid using depressants, including alcohol, sedatives, and narcotics.  Do not use any products that contain nicotine or tobacco, such as cigarettes, e-cigarettes, and chewing tobacco. If you need help quitting, ask your health care provider. General instructions  Take over-the-counter and prescription medicines only as told by your health care provider.  If you were given a device to open your airway while you sleep, use it only as told by your health care provider.  If you are having surgery, make sure to tell your health care provider you have sleep apnea. You may need to bring your device with you.  Keep all follow-up visits as told by your health care provider. This is important. Contact a health care provider if:  The device that you received to open your airway during sleep is uncomfortable or does not seem to be working.  Your symptoms do not improve.  Your symptoms get worse. Get help right away if:  You develop: ? Chest pain. ? Shortness of breath. ? Discomfort in your back, arms, or stomach.  You have: ? Trouble speaking. ? Weakness on one side of your body. ? Drooping in your face. These symptoms may represent a serious problem that is an emergency. Do not wait to see if the symptoms will go away. Get medical help right away. Call your local emergency services (911 in the [place] Do not drive yourself to the hospital. Summary  Sleep apnea is a condition in which breathing pauses or becomes shallow during sleep.  The most common cause is a collapsed or blocked airway.  The goal of treatment is to restore normal breathing and to ease symptoms during sleep. This information is not intended to replace advice given to  you by your health care provider. Make sure you discuss any questions you have with your health care provider. Document Revised: 03/01/2019 Document Reviewed: 05/10/2018 Elsevier Patient Education  2021 [employer].

## 2021-09-15 ENCOUNTER — Other Ambulatory Visit: Payer: Self-pay | Admitting: Family Medicine

## 2021-09-15 DIAGNOSIS — I10 Essential (primary) hypertension: Secondary | ICD-10-CM

## 2021-09-15 NOTE — Telephone Encounter (Signed)
Requested medication (s) are due for refill today: N/A  Requested medication (s) are on the active medication list: yes  Last refill:  05/08/2020  Future visit scheduled: no  Notes to clinic:  newer rx ordered in April for 6 months at different pharm. This is old rx from different pharm, last seen 12/2020, no upcoming appt scheduled.   Requested Prescriptions  Pending Prescriptions Disp Refills   metoprolol succinate (TOPROL-XL) 25 MG 24 hr tablet [Pharmacy Med Name: METOPROLOL SUCC ER 25 MG TAB] 10 tablet     Sig: TAKE 1 TABLET BY MOUTH EVERY DAY     Cardiovascular:  Beta Blockers Failed - 09/15/2021  9:58 AM      Failed - Valid encounter within last 6 months    Recent Outpatient Visits           8 months ago Essential hypertension   [facility] Juline Patch, MD   1 year ago Essential hypertension   [facility] Juline Patch, MD   1 year ago Essential hypertension   [facility] Juline Patch, MD   2 years ago Establishing care with new doctor, encounter for   [place], [place]              Passed - Last BP in normal range    BP Readings from Last 1 Encounters:  01/02/21 122/80          Passed - Last Heart Rate in normal range    Pulse Readings from Last 1 Encounters:  01/02/21 80

## 2021-09-16 ENCOUNTER — Telehealth: Payer: Self-pay

## 2021-09-16 NOTE — Telephone Encounter (Signed)
I called pt and left message to call us back and schedule appt for within the next 30 days for med refill. I sent in 30 days with 0 refills

## 2021-10-06 ENCOUNTER — Other Ambulatory Visit: Payer: Self-pay | Admitting: Family Medicine

## 2021-10-06 DIAGNOSIS — I10 Essential (primary) hypertension: Secondary | ICD-10-CM

## 2021-10-06 NOTE — Telephone Encounter (Signed)
Requested medications are due for refill today.  yes  Requested medications are on the active medications list.  yes  Last refill. 09/16/2021  Future visit scheduled.   no  Notes to clinic.  Pt is more than 3 months overdue for OV.     Requested Prescriptions  Pending Prescriptions Disp Refills   metoprolol succinate (TOPROL-XL) 25 MG 24 hr tablet [Pharmacy Med Name: METOPROLOL SUCC ER 25 MG TAB] 15 tablet 0    Sig: TAKE 1 TABLET BY MOUTH EVERY DAY     Cardiovascular:  Beta Blockers Failed - 10/06/2021  8:31 AM      Failed - Valid encounter within last 6 months    Recent Outpatient Visits           9 months ago Essential hypertension   [facility] Juline Patch, MD   1 year ago Essential hypertension   [facility] Juline Patch, MD   1 year ago Essential hypertension   [facility] Juline Patch, MD   2 years ago Establishing care with new doctor, encounter for   [place], [place]              Passed - Last BP in normal range    BP Readings from Last 1 Encounters:  01/02/21 122/80          Passed - Last Heart Rate in normal range    Pulse Readings from Last 1 Encounters:  01/02/21 80

## 2021-10-16 ENCOUNTER — Other Ambulatory Visit: Payer: Self-pay

## 2021-10-16 ENCOUNTER — Ambulatory Visit: Payer: BC Managed Care – PPO | Admitting: Family Medicine

## 2021-10-16 ENCOUNTER — Encounter: Payer: Self-pay | Admitting: Family Medicine

## 2021-10-16 DIAGNOSIS — I10 Essential (primary) hypertension: Secondary | ICD-10-CM | POA: Diagnosis not present

## 2021-10-16 MED ORDER — AMLODIPINE BESYLATE 5 MG PO TABS: 5.0000 mg | ORAL_TABLET | Freq: Every day | ORAL | 1 refills | Status: DC

## 2021-10-16 MED ORDER — METOPROLOL SUCCINATE ER 25 MG PO TB24: 25.0000 mg | ORAL_TABLET | Freq: Every day | ORAL | 1 refills | Status: DC

## 2021-10-16 NOTE — Progress Notes (Addendum)
Date:  10/16/2021   Name:  Rebekah Vasquez   DOB:  09-26-1971   MRN:  [id number]   Chief Complaint: No chief complaint on file.  Hypertension This is a chronic problem. The current episode started more than 1 year ago. The problem has been waxing and waning since onset. The problem is uncontrolled. Pertinent negatives include no anxiety, blurred vision, chest pain, headaches, malaise/fatigue, neck pain, orthopnea, palpitations, peripheral edema, PND, shortness of breath or sweats. There are no associated agents to hypertension. Risk factors for coronary artery disease include dyslipidemia. Past treatments include beta blockers and calcium channel blockers. The current treatment provides moderate improvement. There are no compliance problems.  There is no history of angina, kidney disease, CAD/MI, CVA, heart failure, left ventricular hypertrophy, PVD or retinopathy. There is no history of chronic renal disease, a hypertension causing med or renovascular disease.  Shoulder Injury  [place] home. The left shoulder is affected. The incident occurred more than 1 week ago. The injury mechanism was a fall. The quality of the pain is described as aching. The pain does not radiate. The pain is at a severity of 7/10. The pain is moderate. Pertinent negatives include no chest pain, muscle weakness, numbness or tingling. The symptoms are aggravated by movement, overhead lifting and palpation. She has tried NSAIDs (massage) for the symptoms. The treatment provided no relief (temporary).   No results found for: NA, K, CO2, GLUCOSE, BUN, CREATININE, CALCIUM, EGFR, GFRNONAA, GLUCOSE Lab Results  Component Value Date   CHOL 212 (H) 05/16/2020   HDL 61 05/16/2020   LDLCALC 132 (H) 05/16/2020   TRIG 109 05/16/2020   No results found for: TSH No results found for: HGBA1C No results found for: WBC, HGB, HCT, MCV, PLT No results found for: ALT, AST, GGT, ALKPHOS, BILITOT No results found for:  25OHVITD2, 25OHVITD3, VD25OH   Review of Systems  Constitutional:  Negative for chills, fever and malaise/fatigue.  HENT:  Negative for drooling, ear discharge, ear pain and sore throat.   Eyes:  Negative for blurred vision.  Respiratory:  Negative for cough, shortness of breath and wheezing.   Cardiovascular:  Negative for chest pain, palpitations, orthopnea, leg swelling and PND.  Gastrointestinal:  Negative for abdominal pain, blood in stool, constipation, diarrhea and nausea.  Endocrine: Negative for polydipsia.  Genitourinary:  Negative for dysuria, frequency, hematuria and urgency.  Musculoskeletal:  Negative for back pain, myalgias and neck pain.  Skin:  Negative for rash.  Allergic/Immunologic: Negative for environmental allergies.  Neurological:  Negative for dizziness, tingling, numbness and headaches.  Hematological:  Does not bruise/bleed easily.  Psychiatric/Behavioral:  Negative for suicidal ideas. The patient is not nervous/anxious.    Patient Active Problem List   Diagnosis Date Noted   Benign essential HTN 11/11/2018   Stable angina pectoris ([place]) 11/11/2018   Premenstrual tension syndrome 11/10/2018   Migraine 09/28/2005    Allergies  Allergen Reactions   Hydrochlorothiazide Other (See Comments)   Propranolol Other (See Comments)   Penicillins Rash    Past Surgical History:  Procedure Laterality Date   ABDOMINAL SURGERY      Social History   Tobacco Use   Smoking status: Never   Smokeless tobacco: Never  Vaping Use   Vaping Use: Never used  Substance Use Topics   Alcohol use: Yes    Comment: social   Drug use: Never     Medication list has been reviewed and updated.  Current Meds  Medication Sig   Fluoxetine HCl, PMDD, 20 MG TABS Take by mouth.    PHQ 2/9 Scores 05/08/2020 11/10/2018  PHQ - 2 Score 0 0  PHQ- 9 Score 3 0    GAD 7 : Generalized Anxiety Score 05/08/2020  Nervous, Anxious, on Edge 1  Control/stop worrying 0  Worry too much  - different things 0  Trouble relaxing 0  Restless 0  Easily annoyed or irritable 1  Afraid - awful might happen 0  Total GAD 7 Score 2  Anxiety Difficulty Not difficult at all    BP Readings from Last 3 Encounters:  01/02/21 122/80  05/16/20 (!) 168/100  05/08/20 (!) 160/98    Physical Exam Vitals and nursing note reviewed.  Constitutional:      Appearance: Normal appearance. She is well-developed.  HENT:     Head: Normocephalic.     Right Ear: External ear normal.     Left Ear: External ear normal.  Eyes:     General: Lids are everted, no foreign bodies appreciated. No scleral icterus.       Left eye: No foreign body or hordeolum.     Conjunctiva/sclera: Conjunctivae normal.     Right eye: Right conjunctiva is not injected.     Left eye: Left conjunctiva is not injected.     Pupils: Pupils are equal, round, and reactive to light.  Neck:     Thyroid: No thyromegaly.     Vascular: No JVD.     Trachea: No tracheal deviation.  Cardiovascular:     Rate and Rhythm: Normal rate and regular rhythm.     Heart sounds: Normal heart sounds. No murmur heard.   No friction rub. No gallop.  Pulmonary:     Effort: Pulmonary effort is normal. No respiratory distress.     Breath sounds: Normal breath sounds. No wheezing or rales.  Abdominal:     General: Bowel sounds are normal.     Palpations: Abdomen is soft. There is no mass.     Tenderness: There is no abdominal tenderness. There is no guarding or rebound.  Musculoskeletal:     Left shoulder: Tenderness present. Decreased range of motion.       Arms:     Cervical back: Normal range of motion and neck supple.  Lymphadenopathy:     Cervical: No cervical adenopathy.  Skin:    General: Skin is warm.     Findings: No rash.  Neurological:     Mental Status: She is alert and oriented to person, place, and time.     Cranial Nerves: No cranial nerve deficit.     Deep Tendon Reflexes: Reflexes normal.  Psychiatric:        Mood  and Affect: Mood is not anxious or depressed.    Wt Readings from Last 3 Encounters:  01/02/21 161 lb (73 kg)  05/16/20 154 lb (69.9 kg)  05/08/20 158 lb (71.7 kg)    Ht 5' 7"  (1.702 m)    BMI 25.22 kg/m   Assessment and Plan:  1. Essential hypertension Chronic.  Controlled.  Stable.  Blood pressure 126/86.  Continue with metoprolol XL 25 mg and amlodipine 5 mg once a day apiece.  Will check renal function panel. - metoprolol succinate (TOPROL-XL) 25 MG 24 hr tablet; Take 1 tablet (25 mg total) by mouth daily.  Dispense: 90 tablet; Refill: 1 - amLODipine (NORVASC) 5 MG tablet; Take 1 tablet (5 mg total) by mouth daily. kowalski  Dispense: 90 tablet;  Refill: 1 - Renal Function Panel - Lipid Panel With LDL/HDL Ratio   Patient has a hurt shoulder that she fell up on in October about 3 months ago.  Patient has difficulty with abduction.  Most of the pain is in the posterior aspect of the left shoulder.  We have discussed and patient is to call back whether or not to go ahead and refer to sports medicine/Dr. Zigmund Daniel or if she is to return to [facility] where she had a previous unrelated injury noted several years ago.

## 2021-10-17 LAB — RENAL FUNCTION PANEL
Albumin: 4.5 g/dL (ref 3.8–4.9)
BUN/Creatinine Ratio: 11 (ref 9–23)
BUN: 10 mg/dL (ref 6–24)
CO2: 24 mmol/L (ref 20–29)
Calcium: 8.8 mg/dL (ref 8.7–10.2)
Chloride: 102 mmol/L (ref 96–106)
Creatinine, Ser: 0.95 mg/dL (ref 0.57–1.00)
Glucose: 87 mg/dL (ref 70–99)
Phosphorus: 3.7 mg/dL (ref 3.0–4.3)
Potassium: 4.2 mmol/L (ref 3.5–5.2)
Sodium: 141 mmol/L (ref 134–144)
eGFR: 73 mL/min/{1.73_m2} (ref >59)

## 2021-10-17 LAB — LIPID PANEL WITH LDL/HDL RATIO
Cholesterol, Total: 212 mg/dL — ABNORMAL HIGH (ref 100–199)
HDL: 60 mg/dL (ref >39)
LDL Chol Calc (NIH): 119 mg/dL — ABNORMAL HIGH (ref 0–99)
LDL/HDL Ratio: 2 ratio (ref 0.0–3.2)
Triglycerides: 190 mg/dL — ABNORMAL HIGH (ref 0–149)
VLDL Cholesterol Cal: 33 mg/dL (ref 5–40)

## 2021-10-27 ENCOUNTER — Ambulatory Visit: Payer: BC Managed Care – PPO | Admitting: Family Medicine

## 2021-10-27 ENCOUNTER — Encounter: Payer: Self-pay | Admitting: Family Medicine

## 2021-10-27 ENCOUNTER — Other Ambulatory Visit: Payer: Self-pay

## 2021-10-27 ENCOUNTER — Encounter: Payer: BC Managed Care – PPO | Admitting: Family Medicine

## 2021-10-27 VITALS — BP 110/80 | HR 64 | Ht 67.0 in | Wt 165.0 lb

## 2021-10-27 DIAGNOSIS — M62838 Other muscle spasm: Secondary | ICD-10-CM | POA: Diagnosis not present

## 2021-10-27 DIAGNOSIS — S46812A Strain of other muscles, fascia and tendons at shoulder and upper arm level, left arm, initial encounter: Secondary | ICD-10-CM | POA: Diagnosis not present

## 2021-10-27 MED ORDER — CYCLOBENZAPRINE HCL 5 MG PO TABS
5.0000 mg | ORAL_TABLET | Freq: Every evening | ORAL | 0 refills | Status: DC | PRN
Start: 2021-10-27 — End: 2022-12-31

## 2021-10-27 MED ORDER — MELOXICAM 15 MG PO TABS: 15.0000 mg | ORAL_TABLET | Freq: Every day | ORAL | 0 refills | Status: DC

## 2021-10-27 NOTE — Assessment & Plan Note (Signed)
Right-hand-dominant patient presenting with acute onset of left posterior shoulder pain while riding in her father to prevent him from falling in the 06/2021 timeframe.  She had immediate onset of posterior left shoulder pain, mild radiation to the left neck, denies any paresthesias, denied any bruising, felt that this area was swollen.  Pain aggravated with reaching, overhead, getting dressed, and sleeping.  Denies any similar episodes in the past.  Treatment today's included rest, heat, ice, ibuprofen, massage, all with mixed benefit.  Examination today reveals full range of motion at the neck and shoulder, there is pain at a discrete point of forward flexion, additionally with left cervical rotation and right greater than left lateral deviation of the neck.  Resisted rotator cuff testing reveals 5/5 strength, mild pain during resisted external rotation, maximal pain during isolated supraspinatus testing, negative Neer's, positive Hawkins, O'Briens, speeds, Yergason's benign.  She has a negative Spurling's test bilaterally.  She has focal tenderness tracking along the left levator scapula, secondarily in the upper trapezius region.  Clinical history, localization of pain, and examination findings are most consistent with strain to the left levator scapula and supraspinatus.  I reviewed the same with the patient as well as treatment strategies given the duration of symptoms.  Plan for scheduled meloxicam x2 weeks then as needed, as needed cyclobenzaprine, and formal physical therapy.  Additional supportive pain control measures reviewed.  Plan for return in 5 weeks for reevaluation.  If suboptimal response noted, anticipate cortisone local injections (trigger point and/or subacromial), possible advanced imaging as clinically guided.

## 2021-10-27 NOTE — Progress Notes (Signed)
Primary Care / Sports Medicine Office Visit  Patient Information:  Patient ID: Rebekah Vasquez, female DOB: [birth year] Age: 51 y.o. MRN: [id number]   Rebekah Vasquez is a pleasant 51 y.o. female presenting with the following:  Chief Complaint  Patient presents with   New Patient (Initial Visit)   Shoulder Pain    Left; injury accrued from a fall 06/2021; no imaging; limited ROM, sharp, deep, intermittent; treatments include heat, ice, ibuprofen, massage with some relief;  right-handedness; 5/10 pain    Vitals:   10/27/21 1533  BP: 110/80  Pulse: 64  SpO2: 97%   Vitals:   10/27/21 1533  Weight: 165 lb (74.8 kg)  Height: 5\' 7"  (1.702 m)   Body mass index is 25.84 kg/m.  No results found.   Independent interpretation of notes and tests performed by another provider:   None  Procedures performed:   None  Pertinent History, Exam, Impression, and Recommendations:   Strain of left supraspinatus muscle Right-hand-dominant patient presenting with acute onset of left posterior shoulder pain while riding in her father to prevent him from falling in the 06/2021 timeframe.  She had immediate onset of posterior left shoulder pain, mild radiation to the left neck, denies any paresthesias, denied any bruising, felt that this area was swollen.  Pain aggravated with reaching, overhead, getting dressed, and sleeping.  Denies any similar episodes in the past.  Treatment today's included rest, heat, ice, ibuprofen, massage, all with mixed benefit.  Examination today reveals full range of motion at the neck and shoulder, there is pain at a discrete point of forward flexion, additionally with left cervical rotation and right greater than left lateral deviation of the neck.  Resisted rotator cuff testing reveals 5/5 strength, mild pain during resisted external rotation, maximal pain during isolated supraspinatus testing, negative Neer's, positive Hawkins, O'Briens, speeds, Yergason's benign.  She has  a negative Spurling's test bilaterally.  She has focal tenderness tracking along the left levator scapula, secondarily in the upper trapezius region.  Clinical history, localization of pain, and examination findings are most consistent with strain to the left levator scapula and supraspinatus.  I reviewed the same with the patient as well as treatment strategies given the duration of symptoms.  Plan for scheduled meloxicam x2 weeks then as needed, as needed cyclobenzaprine, and formal physical therapy.  Additional supportive pain control measures reviewed.  Plan for return in 5 weeks for reevaluation.  If suboptimal response noted, anticipate cortisone local injections (trigger point and/or subacromial), possible advanced imaging as clinically guided.  Cervical paraspinal muscle spasm Focal findings to the left levator scapula, acute in onset from 06/2021 timeframe. See additional assessment(s) for plan details.   I provided a total time of 31 minutes including both face-to-face and non-face-to-face time on 10/27/2021 inclusive of time utilized for medical chart review, information gathering, care coordination with staff, and documentation completion. Specifically we reviewed the exact mechanism of injury, relevant anatomy with images reviewed and explained, treatment strategies, possible sequela of no treatment, and goals at follow-up. Questions were answered.   Orders & Medications Meds ordered this encounter  Medications   meloxicam (MOBIC) 15 MG tablet    Sig: Take 1 tablet (15 mg total) by mouth daily.    Dispense:  30 tablet    Refill:  0   cyclobenzaprine (FLEXERIL) 5 MG tablet    Sig: Take 1-2 tablets (5-10 mg total) by mouth at bedtime as needed for muscle spasms.  Dispense:  30 tablet    Refill:  0   Orders Placed This Encounter  Procedures   Ambulatory referral to Physical Therapy     Return in about 5 weeks (around 12/01/2021).     Montel Culver, MD   Primary Care Sports  Medicine [place]

## 2021-10-27 NOTE — Patient Instructions (Signed)
-   Start meloxicam, dose once daily with food x2 weeks, after 2 weeks dose once daily on an as-needed basis - Can dose 1-2 tab of cyclobenzaprine at night for muscle tightness pain - Referral coordinator will contact you to schedule physical therapy - Return for follow-up in 5 weeks, contact for questions between now and then

## 2021-10-27 NOTE — Assessment & Plan Note (Signed)
Focal findings to the left levator scapula, acute in onset from 06/2021 timeframe. See additional assessment(s) for plan details.

## 2021-12-01 ENCOUNTER — Ambulatory Visit: Payer: BC Managed Care – PPO | Admitting: Family Medicine

## 2021-12-02 ENCOUNTER — Encounter: Payer: Self-pay | Admitting: Family Medicine

## 2021-12-02 ENCOUNTER — Ambulatory Visit: Payer: BC Managed Care – PPO | Admitting: Family Medicine

## 2021-12-02 ENCOUNTER — Other Ambulatory Visit: Payer: Self-pay

## 2021-12-02 VITALS — BP 106/82 | HR 55 | Ht 67.0 in | Wt 163.0 lb

## 2021-12-02 DIAGNOSIS — S46812D Strain of other muscles, fascia and tendons at shoulder and upper arm level, left arm, subsequent encounter: Secondary | ICD-10-CM | POA: Diagnosis not present

## 2021-12-02 DIAGNOSIS — M62838 Other muscle spasm: Secondary | ICD-10-CM | POA: Diagnosis not present

## 2021-12-02 MED ORDER — MELOXICAM 7.5 MG PO TABS
7.5000 mg | ORAL_TABLET | Freq: Every day | ORAL | 0 refills | Status: DC | PRN
Start: 2021-12-02 — End: 2022-12-31

## 2021-12-02 NOTE — Assessment & Plan Note (Signed)
Patient returns for follow-up to left shoulder pain in the setting of rotator cuff related impingement and focality to the supraspinatus.  At her last visit on 10/27/2021 she was advised scheduled meloxicam x2 weeks then as needed, appearing cyclobenzaprine, and formal PT.  Since that visit she did dose the meloxicam consistently, otherwise supportive care alone, did not have a chance to start physical therapy.  She cites roughly 50% improvement in her symptoms.  At today's visit her physical examination demonstrates 5/5 rotator cuff strength, equivocal impingement testing.  I have advised further escalation versus continued treatment given her progress.  She has elected to transition meloxicam to 7.5 mg 1-2 tablets on a as needed basis, start formal physical therapy, contact our office if still symptomatic at the end of PT or if requiring consistent meloxicam.  At that time we can consider advanced imaging versus corticosteroid injections. ?

## 2021-12-02 NOTE — Assessment & Plan Note (Signed)
Patient has been dosing meloxicam over interval visit, has noted mild return of pain following being off of this medication for the past week.  Has not had a chance to start formal PT but has been advancing her daily activities.  Examination today reveals benign cervical findings.  She cites for 50% improvement.  I reviewed additional steps to resolve her stated symptoms, plan for formal PT, medication management transition from meloxicam 15 mg to 7.5 mg 1-2 tablets as needed, and status update if still symptomatic at the end of PT.  If so, trigger point injections and/were MRI cervical spine to be considered. ?

## 2021-12-02 NOTE — Patient Instructions (Signed)
-   Transition to 1-2 tablets meloxicam 7.5 mg taken on an as-needed basis for shoulder/neck pain, take with food ?- Start physical therapy, referral coordinator will contact you for scheduling, alternatively can contact number below for expedited scheduling ?- Gradually advance your day-to-day activities using neck and shoulder symptoms as a guide ?- Contact our office after PT complete to provide a status update or for any questions between now and then ?

## 2021-12-02 NOTE — Progress Notes (Signed)
?  ? ?  Primary Care / Sports Medicine Office Visit ? ?Patient Information:  ?Patient ID: Rebekah Vasquez, female DOB: 05-30-1971 Age: 51 y.o. MRN: [id number]  ? ?[place] is a pleasant 51 y.o. female presenting with the following: ? ?Chief Complaint  ?Patient presents with  ? Cervical paraspinal muscle spasm  ? Strain of left supraspinatus muscle  ?  50% better per patient; still tender and having limited ROM; 4/10 pain  ? ? ?Vitals:  ? 12/02/21 1504  ?BP: 106/82  ?Pulse: (!) 55  ?SpO2: 97%  ? ?Vitals:  ? 12/02/21 1504  ?Weight: 163 lb (73.9 kg)  ?Height: '5\' 7"'$  (1.702 m)  ? ?Body mass index is 25.53 kg/m?. ? ?No results found.  ? ?Independent interpretation of notes and tests performed by another provider:  ? ?None ? ?Procedures performed:  ? ?None ? ?Pertinent History, Exam, Impression, and Recommendations:  ? ?Strain of left supraspinatus muscle ?Patient returns for follow-up to left shoulder pain in the setting of rotator cuff related impingement and focality to the supraspinatus.  At her last visit on 10/27/2021 she was advised scheduled meloxicam x2 weeks then as needed, appearing cyclobenzaprine, and formal PT.  Since that visit she did dose the meloxicam consistently, otherwise supportive care alone, did not have a chance to start physical therapy.  She cites roughly 50% improvement in her symptoms.  At today's visit her physical examination demonstrates 5/5 rotator cuff strength, equivocal impingement testing.  I have advised further escalation versus continued treatment given her progress.  She has elected to transition meloxicam to 7.5 mg 1-2 tablets on a as needed basis, start formal physical therapy, contact our office if still symptomatic at the end of PT or if requiring consistent meloxicam.  At that time we can consider advanced imaging versus corticosteroid injections. ? ?Cervical paraspinal muscle spasm ?Patient has been dosing meloxicam over interval visit, has noted mild return of pain following being  off of this medication for the past week.  Has not had a chance to start formal PT but has been advancing her daily activities.  Examination today reveals benign cervical findings.  She cites for 50% improvement.  I reviewed additional steps to resolve her stated symptoms, plan for formal PT, medication management transition from meloxicam 15 mg to 7.5 mg 1-2 tablets as needed, and status update if still symptomatic at the end of PT.  If so, trigger point injections and/were MRI cervical spine to be considered.  ? ?Orders & Medications ?Meds ordered this encounter  ?Medications  ? meloxicam (MOBIC) 7.5 MG tablet  ?  Sig: Take 1-2 tablets (7.5-15 mg total) by mouth daily as needed for pain.  ?  Dispense:  60 tablet  ?  Refill:  0  ? ?Orders Placed This Encounter  ?Procedures  ? Ambulatory referral to Physical Therapy  ?  ? ?No follow-ups on file.  ?  ? ?Montel Culver, MD ? ? Primary Care Sports Medicine ?[facility] ?[place]  ? ?

## 2021-12-08 ENCOUNTER — Encounter: Payer: Self-pay | Admitting: Physical Therapy

## 2021-12-08 ENCOUNTER — Ambulatory Visit: Payer: BC Managed Care – PPO | Attending: Family Medicine | Admitting: Physical Therapy

## 2021-12-08 ENCOUNTER — Other Ambulatory Visit: Payer: Self-pay

## 2021-12-08 DIAGNOSIS — M25612 Stiffness of left shoulder, not elsewhere classified: Secondary | ICD-10-CM | POA: Insufficient documentation

## 2021-12-08 DIAGNOSIS — S46812D Strain of other muscles, fascia and tendons at shoulder and upper arm level, left arm, subsequent encounter: Secondary | ICD-10-CM | POA: Diagnosis not present

## 2021-12-08 DIAGNOSIS — M62838 Other muscle spasm: Secondary | ICD-10-CM | POA: Diagnosis not present

## 2021-12-08 DIAGNOSIS — M25512 Pain in left shoulder: Secondary | ICD-10-CM | POA: Diagnosis not present

## 2021-12-08 DIAGNOSIS — M542 Cervicalgia: Secondary | ICD-10-CM | POA: Insufficient documentation

## 2021-12-08 DIAGNOSIS — M6281 Muscle weakness (generalized): Secondary | ICD-10-CM | POA: Insufficient documentation

## 2021-12-08 NOTE — Therapy (Unsigned)
[facility] [address] [place], [place], [address] Phone: [phone]   Fax:  [phone]  Physical Therapy Evaluation  Patient Details  Name: Rebekah Vasquez MRN: [id number] Date of Birth: 1970-12-08 Referring Provider (PT): Montel Culver, MD   Encounter Date: 12/08/2021   PT End of Session - 12/08/21 1315     Visit Number 1    Number of Visits 13    Date for PT Re-Evaluation 01/19/22    Authorization Type [employer]BS    PT Start Time 0801    PT Stop Time 0846    PT Time Calculation (min) 45 min    Activity Tolerance Patient tolerated treatment well;No increased pain    Behavior During Therapy WFL for tasks assessed/performed             Past Medical History:  Diagnosis Date   Hypertension     Past Surgical History:  Procedure Laterality Date   ABDOMINAL SURGERY      There were no vitals filed for this visit.        [facility] PT Assessment - 12/08/21 0001       Assessment   Medical Diagnosis Cervical paraspinal muscle spasm; strain of left supraspinatus muscle, subsequent encounter    Referring Provider (PT) Montel Culver, MD    Onset Date/Surgical Date 06/28/21    Hand Dominance Right    Next MD Visit F/u prn    Prior Therapy Massage therapy, no PT for present condition      Balance Screen   Has the patient fallen in the past 6 months Yes    How many times? 1   1 Fall attempting to prevent her family member from falling   Has the patient had a decrease in activity level because of a fear of falling?  No    Is the patient reluctant to leave their home because of a fear of falling?  No      Prior Function   Level of Independence Independent      Cognition   Overall Cognitive Status Within Functional Limits for tasks assessed                        SUBJECTIVE  Chief complaint:  Patient is a 51 year old female with primary complaint of neck/L shoulder pain with no paresthesias.  Onset:  Patient is a 51 year old female with primary complaint of neck/L shoulder pain. Patient reports issue began last October when she was taking her elderly parents to beachouse; she states her father began to fall and she attempted to grab him in front of her quickly. They both fell at the time. Pt reports she felt injury in her L shoulder at the time and assumed it would be self-limiting. She reports that her L shoulder has been bothering her for several months. She reports "catching" when attempting to doff/don shirts. Patient has started Meloxicam and she feels that her symptoms have improved after 3 weeks of Meloxicam. Pt is now on lesser dosage of Meloxicam and taking prn only. Patient reports she feels she is able to raise her arm actively without her neck bothering her. Patient reports initial paresthesias, but not experiencing this presently. Patient reports pain along L paracervical region and into L UT and L proximal arm. 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 Patient reports sensation of catching when raising her arm. Pt enjoys walking/walking her dog. Pt likes to complete  HIIT circuits for her primary mode of exercise (home-based group fitness videos).    Referring Dx: Cervical paraspinal muscle spasm; strain of left supraspinatus muscle, subsequent encounter MD: Montel Culver, MD  Pain: 5/10 Present, 2/10 Best, 5/10 Worst: Aggravating factors: lifting, overhead activity, holding leash with L arm, reaching up, donning/doffing shirts, sidelying on L Easing factors: Meloxicam, heating pad, ice packs, Advil, relative rest, stretching neck  24 hour pain behavior: worse in AM Recent neck trauma: Yes, preventing her father from falling Prior history of neck injury or pain: Yes, tension in neck from office work Pain quality: feels like "resistance" to movement, something "pushing into it" when elevating shoulder, tense Radiating pain: No  Numbness/Tingling: No Follow-up  appointment with MD: Yes, f/u if failed conservative intervention Dominant hand: right Imaging: No  Goal: get full ROM, able to return to weight training   OBJECTIVE  Mental Status Patient is oriented to person, place and time.  Recent memory is intact.  Remote memory is intact.  Attention span and concentration are intact.  Expressive speech is intact.  Patient's fund of knowledge is within normal limits for educational level.  SENSATION: Grossly intact to light touch bilateral UE as determined by testing dermatomes C2-T2 Proprioception and hot/cold testing deferred on this date   MUSCULOSKELETAL: Tremor: None Bulk: Normal Tone: Normal  Posture Good self-selected     Palpation Tenderness to palpation along     Strength R/L 5/5 Shoulder flexion (anterior deltoid/pec major/coracobrachialis, axillary n. (C5/6) and musculocutaneous n. (C5-7)) 5/4+ Shoulder abduction (deltoid/supraspinatus, axillary/suprascapular n, C5) 5/4 Shoulder external rotation (infraspinatus/teres minor) 5/4+ Shoulder internal rotation (subcapularis/lats/pec major) 5/5 Elbow flexion (biceps brachii, brachialis, brachioradialis, musculoskeletal n, C5/6) 5/5 Elbow extension (triceps, radial n, C7) 5/5 Wrist Extension (C6/7) 5/5 Wrist Flexion (C6/7) 5/5 Finger adduction (interossei, ulnar n, T1) Cervical isometrics are strong in all directions;  AROM R/L 50 Cervical Flexion ("pull") 50 Cervical Extension ("pull") 31/31 Cervical Lateral Flexion 70/75*  Cervical Rotation *Indicates pain, overpressure performed unless otherwise indicated  Shoulder R/L 161/140* Shoulder Flexion 153/147* Shoulder Abduction 85/85* Shoulder ER 70/70 Shoulder IR   Repeated Movements No centralization or peripheralization of symptoms with repeated cervical protraction and retraction.    Muscle Length Upper Trap: Levator:    Passive Accessory Intervertebral Motion (PAIVM) Pt denies reproduction of neck pain  with CPA C2-T7 and UPA bilaterally C2-T7. Generally hypomobile throughout   SPECIAL TESTS Spurlings A (ipsilateral lateral flexion/axial compression): R: Negative L: Negative Distraction Test: Negative  Hoffman Sign (cervical cord compression): R: Negative L: Negative  Speed's: Negative Empty Can: Negative  O'Brien's: Mild positive     ASSESSMENT Clinical Impression: Pt is a pleasant year-old female/female referred for neck pain. PT examination reveals deficits . Pt presents with deficits in strength, mobility, range of motion, and pain. Pt will benefit from skilled PT services to address deficits and return to pain-free function at home and work.   Low (stable): no personal factors/comorbidities, 1-2 body systems/activity limitations/participation restrictions   Moderate (evolving): 1-2 personal factors/comorbidities, 3 or more body systems/activity limitations/participation restrictions   High (unstable): 3 or more personal factors/comorbidities, 4 or more body systems/activity limitations/participation restrictions   PLAN Next Visit: HEP:    Pt will be independent with HEP in order to improve strength and decrease back pain in order to improve pain-free function at home and work.     Pt will demonstrate decrease in NDI by at least 19% in order to demonstrate clinically significant reduction in disability related to  neck injury/pain   Pt will decrease worst neck pain as reported on NPRS by at least 2 points in order to demonstrate clinically significant reduction in back pain.   Pt will increase strength of by at least 1/2 MMT grade in order to demonstrate improvement in strength and function.                           PT Long Term Goals - 12/08/21 1319       PT LONG TERM GOAL #1   Title Patient will demonstrate improved function as evidenced by a score of 71 on FOTO measure for full participation in activities at home and in the community.    Baseline  12/08/21: 61    Time 6    Period Weeks    Status New    Target Date 01/19/22                     Patient will benefit from skilled therapeutic intervention in order to improve the following deficits and impairments:     Visit Diagnosis: No diagnosis found.     Problem List Patient Active Problem List   Diagnosis Date Noted   Cervical paraspinal muscle spasm 10/27/2021   Strain of left supraspinatus muscle 10/27/2021   Benign essential HTN 11/11/2018   Stable angina pectoris ([place]) 11/11/2018   Premenstrual tension syndrome 11/10/2018   Migraine 09/28/2005   Valentina Gu, PT, DPT #[id number]  Eilleen Kempf, PT 12/08/2021, 1:20 PM  [place] [facility] [address]. [place], [place], [address] Phone: [phone]   Fax:  [phone]  Name: Rebekah Vasquez MRN: [id number] Date of Birth: 07/09/1971

## 2021-12-16 ENCOUNTER — Ambulatory Visit: Payer: BC Managed Care – PPO | Admitting: Physical Therapy

## 2021-12-16 NOTE — Patient Instructions (Incomplete)
Passive Accessory Mobility  ? ? ? ?TREATMENT ? ? ?Manual Therapy - for symptom modulation, soft tissue sensitivity and mobility, joint mobility, ROM  ? ?STM/DTM L upper trapezius, L supraspinatus ? ? ?Neuromuscular Re-education - for nervous system downregulation, gluteal musculature activation and exercises to promote LE kinetic chain stability ? ? ? ?Therapeutic Activities - patient education, repetitive task practice for improved performance of daily functional activities e.g. transferring ? ? ? ?Therapeutic Exercise - for improved soft tissue flexibility and extensibility as needed for ROM, improved strength as needed to improve performance of CKC activities/functional movements ? ? ? ?

## 2021-12-17 ENCOUNTER — Ambulatory Visit: Payer: BC Managed Care – PPO | Admitting: Physical Therapy

## 2021-12-17 ENCOUNTER — Encounter: Payer: Self-pay | Admitting: Physical Therapy

## 2021-12-17 ENCOUNTER — Other Ambulatory Visit: Payer: Self-pay

## 2021-12-17 DIAGNOSIS — M25612 Stiffness of left shoulder, not elsewhere classified: Secondary | ICD-10-CM

## 2021-12-17 DIAGNOSIS — M25512 Pain in left shoulder: Secondary | ICD-10-CM | POA: Diagnosis not present

## 2021-12-17 DIAGNOSIS — M542 Cervicalgia: Secondary | ICD-10-CM

## 2021-12-17 DIAGNOSIS — M6281 Muscle weakness (generalized): Secondary | ICD-10-CM

## 2021-12-17 NOTE — Therapy (Signed)
[facility] ?[address] [place], [place], [address] ?Phone: [phone]   Fax:  [phone] ? ?Physical Therapy Treatment ? ?Patient Details  ?Name: Rebekah Vasquez ?MRN: [id number] ?Date of Birth: [birth year] ?Referring Provider (PT): Montel Culver, MD ? ? ?Encounter Date: 12/17/2021 ? ? PT End of Session - 12/19/21 2217   ? ? Visit Number 2   ? Number of Visits 13   ? Date for PT Re-Evaluation 01/19/22   ? Authorization Type BCBS   ? PT Start Time 0800   ? PT Stop Time [id number]   ? PT Time Calculation (min) 42 min   ? Activity Tolerance Patient tolerated treatment well;No increased pain   ? Behavior During Therapy [facility] for tasks assessed/performed   ? ?  ?  ? ?  ? ? ? ?Past Medical History:  ?Diagnosis Date  ? Hypertension   ? ? ?Past Surgical History:  ?Procedure Laterality Date  ? ABDOMINAL SURGERY    ? ? ?There were no vitals filed for this visit. ? ? Subjective Assessment - 12/19/21 2218   ? ? Subjective Patient reports feeling similar pain along supraspinatus fossa region. Patient reports that her symptoms are worse in the AM. Patient reports doing well with initial HEP.   ? Pertinent History Patient is a 51 year old female with primary complaint of neck/L shoulder pain. Patient reports issue began last October when she was taking her elderly parents to [place]ouse; she states her father began to fall (she was behind him) and she attempted to grab him in front of her abruptly. They both fell at the time. Pt reports she felt injury in her L shoulder at the time and assumed it would be self-limiting. She reports that her L shoulder has been bothering her for several months. She reports "catching" when attempting to doff/don shirts. Patient has started Meloxicam and she feels that her symptoms have improved after 3 weeks of Meloxicam. Pt is now on lesser dosage of Meloxicam and taking prn only. Patient reports she feels she is able to raise her arm actively without her neck  bothering her. Patient reports initial paresthesias, but not experiencing this presently. Patient reports pain along L paracervical region and into L UT and L proximal arm. 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 Patient reports sensation of catching when raising her arm. Pt enjoys walking for exercise and walking her dog. Pt likes to complete HIIT circuits for  exercise (home-based group fitness videos).   ? Limitations Lifting;House hold activities   overhead activity  ? Diagnostic tests None to date   ? Patient Stated Goals get full ROM, able to return to weight training   ? ?  ?  ? ?  ? ? ? ? ? ?Passive Accessory Mobility ?Hypomobility with C3-6 CPA ?Decreased sideglide C3-C6 bilaterally ?Decreased GHJ mobility inferior and posterior, moderate restriction ? ?L shoulder PROM WFL without pain with end-range overpressure ? ? ? ? ?TREATMENT ? ? ?Manual Therapy - for symptom modulation, soft tissue sensitivity and mobility, joint mobility, ROM  ? ?STM/DTM/TPR: L upper trapezius, L supraspinatus  ?Cervical sideglides bilaterally C3-6 gr II for pain control gr III for cervical spine facet joint mobility  ? ?Passive L upper trapezius stretching; 2x30sec  ? ? ?Therapeutic Exercise - for improved soft tissue flexibility and extensibility as needed for ROM ? ?Shoulder ER with 2-lb Dumbbell; 2x10  ? ?Cervical sidebend/UT stretch; reviewed ? ?Serratus slide at wall; 2x8 ? ?  Shoulder ER/IR isometrics; reviewed ? ?Shoulder ER/IR reactive isometrics; Red Tband; 2x10 each direction  ? ? ?*next visit* ?Upper body ergometer, 2 minutes forward, 2 minutes backward - for tissue warm-up to improve muscle performance, improved soft tissue mobility/extensibility - unbilled time ? ? ? ? PT Short Term Goals - 12/09/21 0849   ? ?  ? PT SHORT TERM GOAL #1  ? Title Pt will be independent with HEP in order to improve mobility/ROM and decrease neck pain in order to improve pain-free function at home and work.   ?  Baseline 12/08/21: Baseline HEP initiated   ? Time 2   ? Period Weeks   ? Status New   ? Target Date 12/24/21   ?  ? PT SHORT TERM GOAL #2  ? Title Patient will have normal cervical spine AROM without reproduction of pain as needed for scanning environment, driving, overhead activity, self-care   ? Baseline 12/08/21: motion loss with extension and bilateral lateral flexion, pain with L rotation   ? Time 4   ? Period Weeks   ? Status New   ? Target Date 01/07/22   ? ?  ?  ? ?  ? ? ? ? PT Long Term Goals - 12/08/21 1319   ? ?  ? PT LONG TERM GOAL #1  ? Title Patient will demonstrate improved function as evidenced by a score of 71 on FOTO measure for full participation in activities at home and in the community.   ? Baseline 12/08/21: 61   ? Time 6   ? Period Weeks   ? Status New   ? Target Date 01/19/22   ?  ? PT LONG TERM GOAL #2  ? Title Patient will improve shoulder AROM to within 5 degrees of contralateral upper extremity without reproduction of pain accessing full ROM as needed for reaching, overhead activity, self-care   ? Baseline 12/08/21: Shoulder AROM R/L Flexion 161/140*, Abduction 153/147*, ER 85/85*   pain with L flexion, abd, ER  ? Time 6   ? Period Weeks   ? Status New   ? Target Date 01/19/22   ?  ? PT LONG TERM GOAL #3  ? Title Pt will increase strength of shoulder abduction and ER/IR to 5/5 in order to demonstrate improvement in strength and function as needed for return to desired resistance training routine and HIIT workouts   ? Baseline 12/08/21: MMT [place] R/L Flexion 5/5, ABD 5/4+, ER 5/4*, IR 5/4+   ? Time 6   ? Period Weeks   ? Status New   ? Target Date 01/19/22   ?  ? PT LONG TERM GOAL #4  ? Title Pt will decrease worst neck/shoulder pain as reported on NPRS no greater than 2/10 in order to demonstrate clinically significant reduction in pain as needed for particpiation in desired exercises and household chores without pain as limiting factor   ? Baseline 12/08/21: Pain 5/10 at worst   ? Time 6    ? Period Weeks   ? Status New   ? Target Date 01/19/22   ?  ? PT LONG TERM GOAL #5  ? Title Patient will perform single-arm row with RPE 5-6 for 2 sets of 12-15 repetitions with sound scapular retraction/depression, good eccentric control, and no reproduction of shoulder pain simulating single-arm pulling when using dog leash and compound upper body exercise with pt returning to desired training regimen   ? Baseline 12/08/21: pain with lifting weights, dog leash pulling on  LUE   ? Time 6   ? Period Weeks   ? Status New   ? Target Date 01/19/22   ? ?  ?  ? ?  ? ? ? ? ? ? ? ? Plan - 12/19/21 2220   ? ? Clinical Impression Statement Patient demonstrates full L shoulder PROM during manual therapy in clinic today and is able to access full cervical spine sidebending and rotation during manual treatment. She tolerates modest progression of RTC re-training well today with modest progression to sidelying ER and reactive isometrics. Patietn presents with remaining deficits in L shoulder AROM, L shoulder pain, and cervical spine and GHJ accessory mobility. Pt will benefit from continued skilled PT per established POC.   ? Personal Factors and Comorbidities Comorbidity 1;Time since onset of injury/illness/exacerbation   ? Comorbidities Hypertension   ? Examination-Activity Limitations Dressing;Reach Overhead;Carry;Lift;Sleep   ? Examination-Participation Restrictions Occupation;Cleaning   ? Stability/Clinical Decision Making Evolving/Moderate complexity   ? Rehab Potential Good   ? PT Frequency 2x / week   ? PT Duration 6 weeks   ? PT Treatment/Interventions Cryotherapy;Electrical Stimulation;Moist Heat;Therapeutic activities;Therapeutic exercise;Neuromuscular re-education;Patient/family education;Manual techniques;Passive range of motion;Dry needling   ? PT Next Visit Plan Manual techniques for sensitivity along L UT/supraspinatus, isometrics to promote RTC healing, cervical spine and L shoulder mobility, progressive L  shoulder ROM; progress toward RTC strengthening and return to function for lifting as condition improves   ? PT Home Exercise Plan Access Code [email or web address]   ? Consulted and Agree with Plan of Care Patient   ?

## 2021-12-23 ENCOUNTER — Encounter: Payer: Self-pay | Admitting: Physical Therapy

## 2021-12-25 ENCOUNTER — Ambulatory Visit: Payer: BC Managed Care – PPO | Admitting: Physical Therapy

## 2021-12-25 DIAGNOSIS — M25512 Pain in left shoulder: Secondary | ICD-10-CM

## 2021-12-25 DIAGNOSIS — M542 Cervicalgia: Secondary | ICD-10-CM

## 2021-12-25 DIAGNOSIS — M6281 Muscle weakness (generalized): Secondary | ICD-10-CM

## 2021-12-25 DIAGNOSIS — M25612 Stiffness of left shoulder, not elsewhere classified: Secondary | ICD-10-CM

## 2021-12-25 NOTE — Therapy (Addendum)
?OUTPATIENT PHYSICAL THERAPY TREATMENT NOTE ? ? ?Patient Name: Rebekah Vasquez ?MRN: [id number] ?DOB:10/05/1970, 51 y.o., female ?Today's Date: 12/28/2021 ? ?PCP: Juline Patch, MD ?REFERRING PROVIDER: Montel Culver, MD ? ? PT End of Session - 12/28/21 1250   ? ? Visit Number 3   ? Number of Visits 13   ? Date for PT Re-Evaluation 01/19/22   ? Authorization Type BCBS   ? PT Start Time 1300   ? PT Stop Time 5625   ? PT Time Calculation (min) 45 min   ? Activity Tolerance Patient tolerated treatment well;No increased pain   ? Behavior During Therapy [facility] for tasks assessed/performed   ? ?  ?  ? ?  ? ? ?Past Medical History:  ?Diagnosis Date  ? Hypertension   ? ?Past Surgical History:  ?Procedure Laterality Date  ? ABDOMINAL SURGERY    ? ?Patient Active Problem List  ? Diagnosis Date Noted  ? Cervical paraspinal muscle spasm 10/27/2021  ? Strain of left supraspinatus muscle 10/27/2021  ? Benign essential HTN 11/11/2018  ? Stable angina pectoris ([place]) 11/11/2018  ? Premenstrual tension syndrome 11/10/2018  ? Migraine 09/28/2005  ? ? ?REFERRING DIAG:  ? ?THERAPY DIAG:  ?Left shoulder pain, unspecified chronicity ? ?Neck pain ? ?Stiffness of left shoulder, not elsewhere classified ? ?Muscle weakness (generalized) ? ?PERTINENT HISTORY:  ? ?PRECAUTIONS: None  ? ?SUBJECTIVE: Patient reports having more pain over the last 2 days without known specific aggravating factor. She reports tolerating stretching and isometrics well recently. Patient reports no significant soreness after last visit.  ? ?PAIN:  ?Are you having pain? Yes: NPRS scale: 7-8/10 ?Pain location: L UT/supraspinatus fossa ? ? ? ?  ?OBJECTIVE FINDINGS ? ?AROM ?Cervical flexion:  WNL ?Cervical extension: min motion loss ?Lateral flexion: Right WNL , Left WNL ?Cervical rotation: Right WNL, Left WNL ?*Indicates pain ? ?Shoulder Flexion: R 155, L 155 ?  ?  ?  ?  ?TREATMENT ?  ?  ?Manual Therapy - for symptom modulation, soft tissue sensitivity and mobility, joint  mobility, ROM  ?  ?Manual cervical traction attempted, minimal symptomatic response ?STM/DTM/TPR: L upper trapezius, L supraspinatus  ? ?Passive L upper trapezius stretching; 2x30sec  ? ? ?*not today* ?Cervical sideglides bilaterally C3-6 gr II for pain control gr III for cervical spine facet joint mobility  ?  ?  ?Therapeutic Exercise - for improved soft tissue flexibility and extensibility as needed for ROM ?  ? ?Upper body ergometer, 2 minutes forward, 2 minutes backward - for tissue warm-up to improve muscle performance, improved soft tissue mobility/extensibility - 2 unbilled time, 2 minutes obtaining interval subjective information  ? ?Shoulder ER with 3-lb Dumbbell; 2x10  ?  ?Cervical sidebend/UT stretch;  reviewed ?  ?Serratus slide at wall; 2x8 ?  ?Shoulder ER/IR isometrics; reviewed ?  ?Shoulder ER/IR reactive isometrics; Green Tband; 2x10 each direction  ? ? ?Patient education: HEP update and review to include reactive isometrics with resistance band provided for home ?  ? ?  ? PT Short Term Goals  ? ?  ? PT SHORT TERM GOAL #1  ? Title Pt will be independent with HEP in order to improve mobility/ROM and decrease neck pain in order to improve pain-free function at home and work.   ? Baseline 12/08/21: Baseline HEP initiated   ? Time 2   ? Period Weeks   ? Status New   ? Target Date 12/24/21   ?  ? PT SHORT TERM GOAL #  2  ? Title Patient will have normal cervical spine AROM without reproduction of pain as needed for scanning environment, driving, overhead activity, self-care   ? Baseline 12/08/21: motion loss with extension and bilateral lateral flexion, pain with L rotation   ? Time 4   ? Period Weeks   ? Status New   ? Target Date 01/07/22   ? ?  ?  ? ?  ? ? PT Long Term Goals   ? ?  ? PT LONG TERM GOAL #1  ? Title Patient will demonstrate improved function as evidenced by a score of 71 on FOTO measure for full participation in activities at home and in the community.   ? Baseline 12/08/21: 61   ? Time 6   ?  Period Weeks   ? Status New   ? Target Date 01/19/22   ?  ? PT LONG TERM GOAL #2  ? Title Patient will improve shoulder AROM to within 5 degrees of contralateral upper extremity without reproduction of pain accessing full ROM as needed for reaching, overhead activity, self-care   ? Baseline 12/08/21: Shoulder AROM R/L Flexion 161/140*, Abduction 153/147*, ER 85/85*   pain with L flexion, abd, ER  ? Time 6   ? Period Weeks   ? Status New   ? Target Date 01/19/22   ?  ? PT LONG TERM GOAL #3  ? Title Pt will increase strength of shoulder abduction and ER/IR to 5/5 in order to demonstrate improvement in strength and function as needed for return to desired resistance training routine and HIIT workouts   ? Baseline 12/08/21: MMT [place] R/L Flexion 5/5, ABD 5/4+, ER 5/4*, IR 5/4+   ? Time 6   ? Period Weeks   ? Status New   ? Target Date 01/19/22   ?  ? PT LONG TERM GOAL #4  ? Title Pt will decrease worst neck/shoulder pain as reported on NPRS no greater than 2/10 in order to demonstrate clinically significant reduction in pain as needed for particpiation in desired exercises and household chores without pain as limiting factor   ? Baseline 12/08/21: Pain 5/10 at worst   ? Time 6   ? Period Weeks   ? Status New   ? Target Date 01/19/22   ?  ? PT LONG TERM GOAL #5  ? Title Patient will perform single-arm row with RPE 5-6 for 2 sets of 12-15 repetitions with sound scapular retraction/depression, good eccentric control, and no reproduction of shoulder pain simulating single-arm pulling when using dog leash and compound upper body exercise with pt returning to desired training regimen   ? Baseline 12/08/21: pain with lifting weights, dog leash pulling on LUE   ? Time 6   ? Period Weeks   ? Status New   ? Target Date 01/19/22   ? ?  ?  ? ?  ? ? Plan   ? ? Clinical Impression Statement  Patient demonstrates minimal motion deficits in cervical spine or shoulder forward elevation. She has experienced heightened pain over the last  few days without known aggravating factor. She has had limited attendance in PT to date due to schedule conflicts. She does respond well acutely with soft tissue work along L upper trap/supraspinatus region and stretching. Patient has remaining deficits in decreased strength, local nociceptive L shoulder pain, and sensitivity affecting L UT/supraspinatus. Pt will benefit from continued skilled PT per established POC.   ? Personal Factors and Comorbidities Comorbidity 1;Time since onset  of injury/illness/exacerbation   ? Comorbidities Hypertension   ? Examination-Activity Limitations Dressing;Reach Overhead;Carry;Lift;Sleep   ? Examination-Participation Restrictions Occupation;Cleaning   ? Stability/Clinical Decision Making Evolving/Moderate complexity   ? Rehab Potential Good   ? PT Frequency 2x / week   ? PT Duration 6 weeks   ? PT Treatment/Interventions Cryotherapy;Electrical Stimulation;Moist Heat;Therapeutic activities;Therapeutic exercise;Neuromuscular re-education;Patient/family education;Manual techniques;Passive range of motion;Dry needling   ? PT Next Visit Plan Manual techniques for sensitivity along L UT/supraspinatus, isometrics to promote RTC healing, cervical spine and L shoulder mobility, progressive L shoulder ROM; progress toward RTC strengthening and return to function for lifting as condition improves   ? PT Home Exercise Plan Access Code [email or web address]   ? Consulted and Agree with Plan of Care Patient   ? ?  ?  ? ?  ? ? ?  ? ?Valentina Gu, PT, DPT [phone] ? ?Eilleen Kempf, PT ?12/28/2021, 12:50 PM ? ?  ? ?

## 2021-12-28 ENCOUNTER — Encounter: Payer: Self-pay | Admitting: Physical Therapy

## 2021-12-30 ENCOUNTER — Ambulatory Visit: Payer: BC Managed Care – PPO | Attending: Family Medicine | Admitting: Physical Therapy

## 2021-12-30 DIAGNOSIS — M25512 Pain in left shoulder: Secondary | ICD-10-CM | POA: Diagnosis present

## 2021-12-30 DIAGNOSIS — M542 Cervicalgia: Secondary | ICD-10-CM | POA: Diagnosis present

## 2021-12-30 DIAGNOSIS — M6281 Muscle weakness (generalized): Secondary | ICD-10-CM | POA: Insufficient documentation

## 2021-12-30 DIAGNOSIS — M25612 Stiffness of left shoulder, not elsewhere classified: Secondary | ICD-10-CM | POA: Diagnosis present

## 2021-12-30 NOTE — Therapy (Signed)
?OUTPATIENT PHYSICAL THERAPY TREATMENT NOTE ? ? ?Patient Name: Rebekah Vasquez ?MRN: [id number] ?DOB:1971-06-20, 51 y.o., female ?Today's Date: 01/02/2022 ? ?PCP: Juline Patch, MD ?REFERRING PROVIDER: Montel Culver, MD ? ? PT End of Session - 01/01/22 1331   ? ? Visit Number 4   ? Number of Visits 13   ? Date for PT Re-Evaluation 01/19/22   ? Authorization Type BCBS   ? PT Start Time [phone]   ? PT Stop Time 0900   ? PT Time Calculation (min) 43 min   ? Activity Tolerance Patient tolerated treatment well;No increased pain   ? Behavior During Therapy [facility] for tasks assessed/performed   ? ?  ?  ? ?  ? ? ?Past Medical History:  ?Diagnosis Date  ? Hypertension   ? ?Past Surgical History:  ?Procedure Laterality Date  ? ABDOMINAL SURGERY    ? ?Patient Active Problem List  ? Diagnosis Date Noted  ? Cervical paraspinal muscle spasm 10/27/2021  ? Strain of left supraspinatus muscle 10/27/2021  ? Benign essential HTN 11/11/2018  ? Stable angina pectoris ([place]) 11/11/2018  ? Premenstrual tension syndrome 11/10/2018  ? Migraine 09/28/2005  ? ? ?  ?REFERRING DIAG:   ?I68.032 (ICD-10-CM) - Cervical paraspinal muscle spasm  ?Z22.482N (ICD-10-CM) - Strain of left supraspinatus muscle, subsequent encounter  ? ?  ?THERAPY DIAG:  ?Left shoulder pain, unspecified chronicity ?  ?Neck pain ?  ?Stiffness of left shoulder, not elsewhere classified ?  ?Muscle weakness (generalized) ? ?  ?PERTINENT HISTORY:  Patient is a 51 year old female with primary complaint of neck/L shoulder pain. Patient reports issue began last October when she was taking her elderly parents to [place]; she states her father began to fall (she was behind him) and she attempted to grab him in front of her abruptly. They both fell at the time. Pt reports she felt injury in her L shoulder at the time and assumed it would be self-limiting. She reports that her L shoulder has been bothering her for several months. She reports "catching" when attempting to doff/don shirts.  Patient has started Meloxicam and she feels that her symptoms have improved after 3 weeks of Meloxicam. Pt is now on lesser dosage of Meloxicam and taking prn only. Patient reports she feels she is able to raise her arm actively without her neck bothering her. Patient reports initial paresthesias, but not experiencing this presently. Patient reports pain along L paracervical region and into L UT and L proximal arm. 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 Patient reports sensation of catching when raising her arm. Pt enjoys walking for exercise and walking her dog. Pt likes to complete HIIT circuits for  exercise (home-based group fitness videos).  ?  ?PRECAUTIONS: None  ?  ?SUBJECTIVE: Patient reports moderate soreness after last visit. She reports compliance with her HEP. Pt feels she is responding well with PT to date.  ?  ?PAIN:  ?Are you having pain? No:  ?  ?  ?  ?  ?OBJECTIVE FINDINGS ?  ?AROM ?Cervical flexion:  WNL ?Cervical extension: min motion loss ?Lateral flexion: Right WNL , Left WNL ?Cervical rotation: Right WNL, Left WNL ?*Indicates pain ?  ?Shoulder Flexion: R 155, L 155 ?  ?  ?  ?  ?TREATMENT ?  ?  ?Manual Therapy - for symptom modulation, soft tissue sensitivity and mobility, joint mobility, ROM  ? ?STM/DTM/TPR: L upper trapezius, L supraspinatus  ?   ?  ?*not today* ?  Manual cervical traction attempted, minimal symptomatic response ?Cervical sideglides bilaterally C3-6 gr II for pain control gr III for cervical spine facet joint mobility  ?Passive L upper trapezius stretching; 2x30sec  ?  ? ?Trigger Point Dry Needling (TDN), unbilled ?Education performed with patient regarding potential benefit of TDN. Reviewed precautions and risks with patient. Extensive time spent with pt to ensure full understanding of TDN risks. Pt provided verbal consent to treatment. TDN performed to L upper trapezius and L supraspinatus with 0.25 x 40 single needle placements with local twitch  response (LTR). Pistoning technique utilized. Mild muscular soreness following intervention.  ? ? ? ?  ?Therapeutic Exercise - for improved soft tissue flexibility and extensibility as needed for ROM ?  ?  ?Upper body ergometer, 2 minutes forward, 2 minutes backward - for tissue warm-up to improve muscle performance, improved soft tissue mobility/extensibility - 2 unbilled time, 2 minutes obtaining interval subjective information  ?  ?Shoulder ER with 3-lb Dumbbell; 2x10  ?  ?Cervical sidebend/UT stretch;  2x30sec ?  ?Serratus slide at wall; 2x8, with Red Tband around wrists ?  ?Standing scaption with 2-lb Dbells bilat UE; 2x10 with initial demo and verbal cueing for appropriate angle ? ?Standing row with [place]; 40-lbs; single handles with bilat UE; 2x10 with verbal and tactile cueing for scapular retraction and depression ?  ? ?  ?  ?Patient education: Discussed post-dry needling expectations, recommendation for adequate hydration, continued HEP ?  ? ?*not today* ?Shoulder ER/IR isometrics; reviewed  ?Shoulder ER/IR reactive isometrics; Green Tband; 2x10 each direction  ? ? ?  ?  PT Short Term Goals  ?  ?    ?     ?  PT SHORT TERM GOAL #1  ?  Title Pt will be independent with HEP in order to improve mobility/ROM and decrease neck pain in order to improve pain-free function at home and work.   ?  Baseline 12/08/21: Baseline HEP initiated   ?  Time 2   ?  Period Weeks   ?  Status New   ?  Target Date 12/24/21   ?     ?  PT SHORT TERM GOAL #2  ?  Title Patient will have normal cervical spine AROM without reproduction of pain as needed for scanning environment, driving, overhead activity, self-care   ?  Baseline 12/08/21: motion loss with extension and bilateral lateral flexion, pain with L rotation   ?  Time 4   ?  Period Weeks   ?  Status New   ?  Target Date 01/07/22   ?  ?   ?  ?  ?   ?  ?  PT Long Term Goals   ?  ?    ?     ?  PT LONG TERM GOAL #1  ?  Title Patient will demonstrate improved function as evidenced  by a score of 71 on FOTO measure for full participation in activities at home and in the community.   ?  Baseline 12/08/21: 61   ?  Time 6   ?  Period Weeks   ?  Status New   ?  Target Date 01/19/22   ?     ?  PT LONG TERM GOAL #2  ?  Title Patient will improve shoulder AROM to within 5 degrees of contralateral upper extremity without reproduction of pain accessing full ROM as needed for reaching, overhead activity, self-care   ?  Baseline 12/08/21: Shoulder AROM R/L  Flexion 161/140*, Abduction 153/147*, ER 85/85*   pain with L flexion, abd, ER  ?  Time 6   ?  Period Weeks   ?  Status New   ?  Target Date 01/19/22   ?     ?  PT LONG TERM GOAL #3  ?  Title Pt will increase strength of shoulder abduction and ER/IR to 5/5 in order to demonstrate improvement in strength and function as needed for return to desired resistance training routine and HIIT workouts   ?  Baseline 12/08/21: MMT [place] R/L Flexion 5/5, ABD 5/4+, ER 5/4*, IR 5/4+   ?  Time 6   ?  Period Weeks   ?  Status New   ?  Target Date 01/19/22   ?     ?  PT LONG TERM GOAL #4  ?  Title Pt will decrease worst neck/shoulder pain as reported on NPRS no greater than 2/10 in order to demonstrate clinically significant reduction in pain as needed for particpiation in desired exercises and household chores without pain as limiting factor   ?  Baseline 12/08/21: Pain 5/10 at worst   ?  Time 6   ?  Period Weeks   ?  Status New   ?  Target Date 01/19/22   ?     ?  PT LONG TERM GOAL #5  ?  Title Patient will perform single-arm row with RPE 5-6 for 2 sets of 12-15 repetitions with sound scapular retraction/depression, good eccentric control, and no reproduction of shoulder pain simulating single-arm pulling when using dog leash and compound upper body exercise with pt returning to desired training regimen   ?  Baseline 12/08/21: pain with lifting weights, dog leash pulling on LUE   ?  Time 6   ?  Period Weeks   ?  Status New   ?  Target Date 01/19/22   ?  ?   ?  ?  ?   ?   ?  Plan   ?  ?  Clinical Impression Statement  Patient exhibits improved active shoulder elevation and has minimal cervical spine AROM deficits at this time and no significant reproduction of symptoms w

## 2022-01-01 ENCOUNTER — Encounter: Payer: Self-pay | Admitting: Physical Therapy

## 2022-01-04 ENCOUNTER — Other Ambulatory Visit: Payer: Self-pay | Admitting: Family Medicine

## 2022-01-04 DIAGNOSIS — M62838 Other muscle spasm: Secondary | ICD-10-CM

## 2022-01-05 NOTE — Telephone Encounter (Signed)
Please advise 

## 2022-01-05 NOTE — Telephone Encounter (Signed)
Requested medication (s) are due for refill today - yes ? ?Requested medication (s) are on the active medication list -yes ? ?Future visit scheduled -yes ? ?Last refill: 12/02/21 #60 ? ?Notes to clinic: Request RF: fails lab protocol ? ?Requested Prescriptions  ?Pending Prescriptions Disp Refills  ? meloxicam (MOBIC) 7.5 MG tablet [Pharmacy Med Name: MELOXICAM 7.5 MG TABLET] 60 tablet 0  ?  Sig: Take 1-2 tablets (7.5-15 mg total) by mouth daily as needed for pain.  ?  ? Analgesics:  COX2 Inhibitors Failed - 01/04/2022  9:13 AM  ?  ?  Failed - Manual Review: Labs are only required if the patient has taken medication for more than 8 weeks.  ?  ?  Failed - HGB in normal range and within 360 days  ?  No results found for: HGB, HGBKUC, HGBPOCKUC, HGBOTHER, TOTHGB, HGBPLASMA  ?  ?  ?  Failed - HCT in normal range and within 360 days  ?  No results found for: HCT, HCTKUC, SRHCT  ?  ?  ?  Failed - AST in normal range and within 360 days  ?  No results found for: POCAST, AST  ?  ?  ?  Failed - ALT in normal range and within 360 days  ?  No results found for: ALT, LABALT, POCALT  ?  ?  ?  Passed - Cr in normal range and within 360 days  ?  Creatinine, Ser  ?Date Value Ref Range Status  ?10/16/2021 0.95 0.57 - 1.00 mg/dL Final  ?  ?  ?  ?  Passed - eGFR is 30 or above and within 360 days  ?  eGFR  ?Date Value Ref Range Status  ?10/16/2021 73 >59 mL/min/1.73 Final  ?  ?  ?  ?  Passed - Patient is not pregnant  ?  ?  Passed - Valid encounter within last 12 months  ?  Recent Outpatient Visits   ? ?      ? 1 month ago Cervical paraspinal muscle spasm  ? [facility] Montel Culver, MD  ? 2 months ago Cervical paraspinal muscle spasm  ? [facility] Montel Culver, MD  ? 2 months ago Essential hypertension  ? [facility] Juline Patch, MD  ? 1 year ago Essential hypertension  ? [facility] Juline Patch, MD  ? 1 year ago Essential hypertension  ? [facility] Juline Patch, MD   ? ?  ?  ?Future Appointments   ? ?        ? In 3 months Juline Patch, MD [facility] [place]  ? ?  ? ?  ?  ?  ? ? ? ?Requested Prescriptions  ?Pending Prescriptions Disp Refills  ? meloxicam (MOBIC) 7.5 MG tablet [Pharmacy Med Name: MELOXICAM 7.5 MG TABLET] 60 tablet 0  ?  Sig: Take 1-2 tablets (7.5-15 mg total) by mouth daily as needed for pain.  ?  ? Analgesics:  COX2 Inhibitors Failed - 01/04/2022  9:13 AM  ?  ?  Failed - Manual Review: Labs are only required if the patient has taken medication for more than 8 weeks.  ?  ?  Failed - HGB in normal range and within 360 days  ?  No results found for: HGB, HGBKUC, HGBPOCKUC, HGBOTHER, TOTHGB, HGBPLASMA  ?  ?  ?  Failed - HCT in normal range and within 360 days  ?  No  results found for: HCT, HCTKUC, [place]  ?  ?  ?  Failed - AST in normal range and within 360 days  ?  No results found for: POCAST, AST  ?  ?  ?  Failed - ALT in normal range and within 360 days  ?  No results found for: ALT, LABALT, POCALT  ?  ?  ?  Passed - Cr in normal range and within 360 days  ?  Creatinine, Ser  ?Date Value Ref Range Status  ?10/16/2021 0.95 0.57 - 1.00 mg/dL Final  ?  ?  ?  ?  Passed - eGFR is 30 or above and within 360 days  ?  eGFR  ?Date Value Ref Range Status  ?10/16/2021 73 >59 mL/min/1.73 Final  ?  ?  ?  ?  Passed - Patient is not pregnant  ?  ?  Passed - Valid encounter within last 12 months  ?  Recent Outpatient Visits   ? ?      ? 1 month ago Cervical paraspinal muscle spasm  ? [facility] Montel Culver, MD  ? 2 months ago Cervical paraspinal muscle spasm  ? [facility] Montel Culver, MD  ? 2 months ago Essential hypertension  ? [facility] Juline Patch, MD  ? 1 year ago Essential hypertension  ? [facility] Juline Patch, MD  ? 1 year ago Essential hypertension  ? [facility] Juline Patch, MD  ? ?  ?  ?Future Appointments   ? ?        ? In 3 months Juline Patch, MD [facility] [place]   ? ?  ? ?  ?  ?  ? ? ? ?

## 2022-01-07 NOTE — Telephone Encounter (Signed)
Attempt to call pt 3 times, left 3 VM. Unable to reach. Will send Rebekah Vasquez

## 2022-01-08 NOTE — Telephone Encounter (Signed)
Unable to get ahold of patient

## 2022-01-27 ENCOUNTER — Other Ambulatory Visit: Payer: Self-pay

## 2022-01-27 DIAGNOSIS — I10 Essential (primary) hypertension: Secondary | ICD-10-CM

## 2022-01-27 MED ORDER — METOPROLOL SUCCINATE ER 25 MG PO TB24: 25.0000 mg | ORAL_TABLET | Freq: Every day | ORAL | 0 refills | Status: DC

## 2022-03-24 ENCOUNTER — Other Ambulatory Visit: Payer: Self-pay | Admitting: Family Medicine

## 2022-03-24 DIAGNOSIS — I10 Essential (primary) hypertension: Secondary | ICD-10-CM

## 2022-04-16 ENCOUNTER — Ambulatory Visit: Payer: BC Managed Care – PPO | Admitting: Family Medicine

## 2022-04-26 ENCOUNTER — Other Ambulatory Visit: Payer: Self-pay | Admitting: Family Medicine

## 2022-04-26 DIAGNOSIS — I10 Essential (primary) hypertension: Secondary | ICD-10-CM

## 2022-05-13 ENCOUNTER — Telehealth: Payer: Self-pay | Admitting: Family Medicine

## 2022-05-13 NOTE — Telephone Encounter (Signed)
Copied from [place] [id number]. Topic: General - Call Back - No Documentation >> May 13, 2022 10:45 AM Rebekah Vasquez wrote: Patient states she is returning a call  Please fu w/ patient

## 2022-06-03 ENCOUNTER — Other Ambulatory Visit: Payer: Self-pay | Admitting: Family Medicine

## 2022-06-03 DIAGNOSIS — I10 Essential (primary) hypertension: Secondary | ICD-10-CM

## 2022-06-12 ENCOUNTER — Other Ambulatory Visit: Payer: Self-pay | Admitting: Family Medicine

## 2022-06-12 DIAGNOSIS — I10 Essential (primary) hypertension: Secondary | ICD-10-CM

## 2022-06-16 ENCOUNTER — Telehealth: Payer: Self-pay | Admitting: Family Medicine

## 2022-06-16 NOTE — Telephone Encounter (Signed)
Copied from [place] [id number]. Topic: Referral - Request for Referral >> Jun 16, 2022  1:24 PM Oley Balm E wrote: Has patient seen PCP for this complaint? Yes.   *If NO, is insurance requiring patient see PCP for this issue before PCP can refer them? Referral for which specialty: Cardiology  Preferred provider/office: Dr. Ignacia Bayley [facility]  Reason for referral: Needs new cardiologist

## 2022-06-18 ENCOUNTER — Ambulatory Visit (INDEPENDENT_AMBULATORY_CARE_PROVIDER_SITE_OTHER): Payer: BC Managed Care – PPO | Admitting: Family Medicine

## 2022-06-18 VITALS — BP 120/80 | HR 64 | Ht 67.0 in | Wt 165.0 lb

## 2022-06-18 DIAGNOSIS — E785 Hyperlipidemia, unspecified: Secondary | ICD-10-CM

## 2022-06-18 DIAGNOSIS — R140 Abdominal distension (gaseous): Secondary | ICD-10-CM | POA: Diagnosis not present

## 2022-06-18 DIAGNOSIS — I10 Essential (primary) hypertension: Secondary | ICD-10-CM | POA: Diagnosis not present

## 2022-06-18 DIAGNOSIS — F419 Anxiety disorder, unspecified: Secondary | ICD-10-CM | POA: Diagnosis not present

## 2022-06-18 DIAGNOSIS — F32A Depression, unspecified: Secondary | ICD-10-CM

## 2022-06-18 DIAGNOSIS — Z1211 Encounter for screening for malignant neoplasm of colon: Secondary | ICD-10-CM

## 2022-06-18 MED ORDER — AMLODIPINE BESYLATE 5 MG PO TABS: 5.0000 mg | ORAL_TABLET | Freq: Every day | ORAL | 1 refills | Status: DC

## 2022-06-18 MED ORDER — METOPROLOL SUCCINATE ER 25 MG PO TB24: ORAL_TABLET | ORAL | 1 refills | Status: DC

## 2022-06-18 NOTE — Progress Notes (Signed)
Date:  06/18/2022   Name:  Serenah Mill Doren   DOB:  1970-10-01   MRN:  [id number]   Chief Complaint: Hypertension, Hyperlipidemia, and Colon Cancer Screening  Hypertension This is a chronic problem. The current episode started more than 1 year ago. The problem has been gradually improving since onset. The problem is controlled. Associated symptoms include chest pain. Pertinent negatives include no blurred vision, headaches, orthopnea, palpitations, PND or shortness of breath. There are no associated agents to hypertension. Past treatments include calcium channel blockers and beta blockers. The current treatment provides moderate improvement. There are no compliance problems.  There is no history of angina, kidney disease, CAD/MI, CVA, heart failure, left ventricular hypertrophy, PVD or retinopathy. There is no history of chronic renal disease, a hypertension causing med or renovascular disease.  Hyperlipidemia This is a chronic problem. The problem is controlled. Recent lipid tests were reviewed and are variable. She has no history of chronic renal disease. Associated symptoms include chest pain. Pertinent negatives include no focal sensory loss, focal weakness, myalgias or shortness of breath.    Lab Results  Component Value Date   NA 141 10/16/2021   K 4.2 10/16/2021   CO2 24 10/16/2021   GLUCOSE 87 10/16/2021   BUN 10 10/16/2021   CREATININE 0.95 10/16/2021   CALCIUM 8.8 10/16/2021   EGFR 73 10/16/2021   Lab Results  Component Value Date   CHOL 212 (H) 10/16/2021   HDL 60 10/16/2021   LDLCALC 119 (H) 10/16/2021   TRIG 190 (H) 10/16/2021   No results found for: "TSH" No results found for: "HGBA1C" No results found for: "WBC", "HGB", "HCT", "MCV", "PLT" No results found for: "ALT", "AST", "GGT", "ALKPHOS", "BILITOT" No results found for: "25OHVITD2", "25OHVITD3", "VD25OH"   Review of Systems  HENT:  Negative for trouble swallowing.   Eyes:  Negative for blurred vision.   Respiratory:  Negative for chest tightness and shortness of breath.   Cardiovascular:  Positive for chest pain. Negative for palpitations, orthopnea and PND.  Gastrointestinal:  Negative for abdominal pain and blood in stool.  Endocrine: Negative for polydipsia and polyuria.  Musculoskeletal:  Negative for myalgias.  Neurological:  Negative for focal weakness and headaches.    Patient Active Problem List   Diagnosis Date Noted   Cervical paraspinal muscle spasm 10/27/2021   Strain of left supraspinatus muscle 10/27/2021   Benign essential HTN 11/11/2018   Stable angina pectoris ([place]) 11/11/2018   Premenstrual tension syndrome 11/10/2018   Migraine 09/28/2005    Allergies  Allergen Reactions   Vitamin E Rash   Hydrochlorothiazide Other (See Comments)   Propranolol Other (See Comments)   Penicillins Rash    Past Surgical History:  Procedure Laterality Date   ABDOMINAL SURGERY      Social History   Tobacco Use   Smoking status: Never   Smokeless tobacco: Never  Vaping Use   Vaping Use: Never used  Substance Use Topics   Alcohol use: Yes   Drug use: Never     Medication list has been reviewed and updated.  No outpatient medications have been marked as taking for the 06/18/22 encounter (Office Visit) with Juline Patch, MD.       06/18/2022   10:40 AM 12/02/2021    3:22 PM 10/27/2021    3:36 PM 10/16/2021    3:06 PM  GAD 7 : Generalized Anxiety Score  Nervous, Anxious, on Edge _0 0  Control/stop worrying 0 0 1  0  Worry too much - different things 0 1 1 0  Trouble relaxing 0 0 1 0  Restless 0 0 0 0  Easily annoyed or irritable _0 Afraid - awful might happen 0 0 0 0  Total GAD 7 Score _1 Anxiety Difficulty Not difficult at all Not difficult at all Not difficult at all        06/18/2022   10:36 AM 12/02/2021    3:22 PM 10/27/2021    3:36 PM  Depression screen PHQ 2/9  Decreased Interest 0 1 1  Down, Depressed, Hopeless 1 0 0  PHQ - 2 Score _2 Altered sleeping 0 0 1  Tired, decreased energy _3 Change in appetite  0 0  Feeling bad or failure about yourself  1 0 0  Trouble concentrating 0 0 0  Moving slowly or fidgety/restless 0 0 0  Suicidal thoughts 0 0 0  PHQ-9 Score _4 Difficult doing work/chores Not difficult at all Not difficult at all Not difficult at all    BP Readings from Last 3 Encounters:  06/18/22 120/80  12/02/21 106/82  10/27/21 110/80    Physical Exam Vitals and nursing note reviewed. Exam conducted with a chaperone present.  Constitutional:      General: She is not in acute distress.    Appearance: She is not diaphoretic.  HENT:     Head: Normocephalic and atraumatic.     Right Ear: External ear normal.     Left Ear: External ear normal.     Nose: Nose normal.  Eyes:     General:        Right eye: No discharge.        Left eye: No discharge.     Conjunctiva/sclera: Conjunctivae normal.     Pupils: Pupils are equal, round, and reactive to light.  Neck:     Thyroid: No thyromegaly.     Vascular: No JVD.  Cardiovascular:     Rate and Rhythm: Normal rate and regular rhythm.     Heart sounds: Normal heart sounds. No murmur heard.    No friction rub. No gallop.  Pulmonary:     Effort: Pulmonary effort is normal.     Breath sounds: Normal breath sounds.  Abdominal:     General: Bowel sounds are normal.     Palpations: Abdomen is soft. There is no mass.     Tenderness: There is no abdominal tenderness. There is no guarding.  Musculoskeletal:        General: Normal range of motion.     Cervical back: Normal range of motion and neck supple.  Lymphadenopathy:     Cervical: No cervical adenopathy.  Skin:    General: Skin is warm and dry.  Neurological:     Mental Status: She is alert.     Deep Tendon Reflexes: Reflexes are normal and symmetric.     Wt Readings from Last 3 Encounters:  06/18/22 165 lb (74.8 kg)  12/02/21 163 lb (73.9 kg)  10/27/21 165 lb (74.8 kg)    BP  120/80   Pulse 64   Ht _5  (1.702 m)   Wt 165 lb (74.8 kg)   BMI 25.84 kg/m   Assessment and Plan: 1. Essential hypertension Chronic.  Controlled.  Stable.  Asymptomatic.  Blood pressure 120/80.  Patient is tolerating amlodipine 5 mg once a day and metoprolol XL 25  mg once a day without symptomatology.  We will repeat CMP and lipid panel at this time.  Patient has requested referral to cardiology at [place] with Dr. Freddrick March and medications be handled accordingly. - amLODipine (NORVASC) 5 MG tablet; Take 1 tablet (5 mg total) by mouth daily. kowalski  Dispense: 90 tablet; Refill: 1 - metoprolol succinate (TOPROL-XL) 25 MG 24 hr tablet; TAKE 1 TABLET(25 MG) BY MOUTH DAILY  Dispense: 90 tablet; Refill: 1 - Comprehensive Metabolic Panel (CMET)  2. Dyslipidemia Chronic.  Controlled.  Stable.  Chronic.  Controlled.  Stable.  Check lipid panel for current level of LDL.  And patient is amenable to starting a low-dose statin if weight loss is not an issue.  Pending LDL will progress from there. - Ambulatory referral to Cardiology - Lipid Panel With LDL/HDL Ratio  3. Anxiety and depression Chronic.  Controlled.  Stable.  PHQ is 3.  And GAD score is 4.  This is a gradual improvement from previous and most of the anxiety is job-related.  4. Bloating New onset.  Episodic.  Stable.  Patient is 64 and has never had colonoscopy and is also amenable to proceed with evaluation of colonoscopy. - Ambulatory referral to Gastroenterology  5. Colon cancer screening Discussed and referral made to gastroenterology for colon cancer screening. - Ambulatory referral to Gastroenterology     Otilio Miu, MD

## 2022-06-18 NOTE — Patient Instructions (Addendum)
Mediterranean Diet A Mediterranean diet refers to food and lifestyle choices that are based on the traditions of countries located on the [employer]. It focuses on eating more fruits, vegetables, whole grains, beans, nuts, seeds, and heart-healthy fats, and eating less dairy, meat, eggs, and processed foods with added sugar, salt, and fat. This way of eating has been shown to help prevent certain conditions and improve outcomes for people who have chronic diseases, like kidney disease and heart disease. What are tips for following this plan? Reading food labels Check the serving size of packaged foods. For foods such as rice and pasta, the serving size refers to the amount of cooked product, not dry. Check the total fat in packaged foods. Avoid foods that have saturated fat or trans fats. Check the ingredient list for added sugars, such as corn syrup. Shopping  Buy a variety of foods that offer a balanced diet, including: Fresh fruits and vegetables (produce). Grains, beans, nuts, and seeds. Some of these may be available in unpackaged forms or large amounts (in bulk). Fresh seafood. Poultry and eggs. Low-fat dairy products. Buy whole ingredients instead of prepackaged foods. Buy fresh fruits and vegetables in-season from local farmers markets. Buy plain frozen fruits and vegetables. If you do not have access to quality fresh seafood, buy precooked frozen shrimp or canned fish, such as tuna, salmon, or sardines. Stock your pantry so you always have certain foods on hand, such as olive oil, canned tuna, canned tomatoes, rice, pasta, and beans. Cooking Cook foods with extra-virgin olive oil instead of using butter or other vegetable oils. Have meat as a side dish, and have vegetables or grains as your main dish. This means having meat in small portions or adding small amounts of meat to foods like pasta or stew. Use beans or vegetables instead of meat in common dishes like chili or  lasagna. Experiment with different cooking methods. Try roasting, broiling, steaming, and sauting vegetables. Add frozen vegetables to soups, stews, pasta, or rice. Add nuts or seeds for added healthy fats and plant protein at each meal. You can add these to yogurt, salads, or vegetable dishes. Marinate fish or vegetables using olive oil, lemon juice, garlic, and fresh herbs. Meal planning Plan to eat one vegetarian meal one day each week. Try to work up to two vegetarian meals, if possible. Eat seafood two or more times a week. Have healthy snacks readily available, such as: Vegetable sticks with hummus. Greek yogurt. Fruit and nut trail mix. Eat balanced meals throughout the week. This includes: Fruit: 2-3 servings a day. Vegetables: 4-5 servings a day. Low-fat dairy: 2 servings a day. Fish, poultry, or lean meat: 1 serving a day. Beans and legumes: 2 or more servings a week. Nuts and seeds: 1-2 servings a day. Whole grains: 6-8 servings a day. Extra-virgin olive oil: 3-4 servings a day. Limit red meat and sweets to only a few servings a month. Lifestyle  Cook and eat meals together with your family, when possible. Drink enough fluid to keep your urine pale yellow. Be physically active every day. This includes: Aerobic exercise like running or swimming. Leisure activities like gardening, walking, or housework. Get 7-8 hours of sleep each night. If recommended by your health care provider, drink red wine in moderation. This means 1 glass a day for nonpregnant women and 2 glasses a day for men. A glass of wine equals 5 oz (150 mL). What foods should I eat? Fruits Apples. Apricots. Avocado. Berries. Bananas. Cherries. Dates.  Figs. Grapes. Lemons. Melon. Oranges. Peaches. Plums. Pomegranate. Vegetables Artichokes. Beets. Broccoli. Cabbage. Carrots. Eggplant. Green beans. Chard. Kale. Spinach. Onions. Leeks. Peas. Squash. Tomatoes. Peppers. Radishes. Grains Whole-grain pasta. Brown  rice. Bulgur wheat. Polenta. Couscous. Whole-wheat bread. [place]. Meats and other proteins Beans. Almonds. Sunflower seeds. Pine nuts. Peanuts. [place]. Salmon. Scallops. Shrimp. Dola. Tilapia. Clams. Oysters. Eggs. Poultry without skin. Dairy Low-fat milk. Cheese. Greek yogurt. Fats and oils Extra-virgin olive oil. Avocado oil. Grapeseed oil. Beverages Water. Red wine. Herbal tea. Sweets and desserts Greek yogurt with honey. Baked apples. Poached pears. Trail mix. Seasonings and condiments Basil. Cilantro. Coriander. Cumin. Mint. Parsley. Sage. Rosemary. Tarragon. Garlic. Oregano. Thyme. Pepper. Balsamic vinegar. Tahini. Hummus. Tomato sauce. Olives. Mushrooms. The items listed above may not be a complete list of foods and beverages you can eat. Contact a dietitian for more information. What foods should I limit? This is a list of foods that should be eaten rarely or only on special occasions. Fruits Fruit canned in syrup. Vegetables Deep-fried potatoes (french fries). Grains Prepackaged pasta or rice dishes. Prepackaged cereal with added sugar. Prepackaged snacks with added sugar. Meats and other proteins Beef. Pork. Lamb. Poultry with skin. Hot dogs. Berniece Salines. Dairy Ice cream. Sour cream. Whole milk. Fats and oils Butter. Canola oil. Vegetable oil. Beef fat (tallow). Lard. Beverages Juice. Sugar-sweetened soft drinks. Beer. Liquor and spirits. Sweets and desserts Cookies. Cakes. Pies. Candy. Seasonings and condiments Mayonnaise. Pre-made sauces and marinades. The items listed above may not be a complete list of foods and beverages you should limit. Contact a dietitian for more information. Summary The Mediterranean diet includes both food and lifestyle choices. Eat a variety of fresh fruits and vegetables, beans, nuts, seeds, and whole grains. Limit the amount of red meat and sweets that you eat. If recommended by your health care provider, drink red wine in moderation.  This means 1 glass a day for nonpregnant women and 2 glasses a day for men. A glass of wine equals 5 oz (150 mL). This information is not intended to replace advice given to you by your health care provider. Make sure you discuss any questions you have with your health care provider. Document Revised: 10/20/2019 Document Reviewed: 08/17/2019 Elsevier Patient Education  [place]  LOW-CHOLESTEROL, LOW-TRIGLYCERIDE DIETS    FOODS TO USE   MEATS, FISH Choose lean meats (chicken, [place], veal, and non-fatty cuts of beef with excess fat trimmed; one serving = 3 oz of cooked meat). Also, fresh or frozen fish, canned fish packed in water, and shellfish (lobster, crabs, shrimp, and oysters). Limit use to no more than one serving of one of these per week. Shellfish are high in cholesterol but low in saturated fat and should be used sparingly. Meats and fish should be broiled (pan or oven) or baked on a rack.  EGGS Egg substitutes and egg whites (use freely). Egg yolks (limit two per week).  FRUITS Eat three servings of fresh fruit per day (1 serving =  cup). Be sure to have at least one citrus fruit daily. Frozen and canned fruit with no sugar or syrup added may be used.  VEGETABLES Most vegetables are not limited (see next page). One dark-green (string beans, escarole) or one deep yellow (squash) vegetable is recommended daily. Cauliflower, broccoli, and celery, as well as potato skins, are recommended for their fiber content. (Fiber is associated with cholesterol reduction) It is preferable to steam vegetables, but they may be boiled, strained, or braised with polyunsaturated  vegetable oil (see below).  BEANS Dried peas or beans (1 serving =  cup) may be used as a bread substitute.  NUTS Almonds, walnuts, and peanuts may be used sparingly  (1 serving = 1 Tablespoonful). Use pumpkin, sesame, or sunflower seeds.  BREADS, GRAINS One roll or one slice of whole grain or enriched bread  may be used, or three soda crackers or four pieces of melba toast as a substitute. Spaghetti, rice or noodles ( cup) or  large ear of corn may be used as a bread substitute. In preparing these foods do not use butter or shortening, use soft margarine. Also use egg and sugar substitutes.  Choose high fiber grains, such as oats and whole wheat.  CEREALS Use  cup of hot cereal or  cup of cold cereal per day. Add a sugar substitute if desired, with 99% fat free or skim milk.  MILK PRODUCTS Always use 99% fat free or skim milk, dairy products such as low fat cheeses (farmer's uncreamed diet cottage), low-fat yogurt, and powdered skim milk.  FATS, OILS Use soft (not stick) margarine; vegetable oils that are high in polyunsaturated fats (such as safflower, sunflower, soybean, corn, and cottonseed). Always refrigerate meat drippings to harden the fat and remove it before preparing gravies  DESSERTS, SNACKS Limit to two servings per day; substitute each serving for a bread/cereal serving: ice milk, water sherbet (1/4 cup); unflavored gelatin or gelatin flavored with sugar substitute (1/3 cup); pudding prepared with skim milk (1/2 cup); egg white souffls; unbuttered popcorn (1  cups). Substitute carob for chocolate.  BEVERAGES Fresh fruit juices (limit 4 oz per day); black coffee, plain or herbal teas; soft drinks with sugar substitutes; club soda, preferably salt-free; cocoa made with skim milk or nonfat dried milk and water (sugar substitute added if desired); clear broth. Alcohol: limit two servings per day (see second page).  MISCELLANEOUS  You may use the following freely: vinegar, spices, herbs, nonfat bouillon, mustard, Worcestershire sauce, soy sauce, flavoring essence.                  GUIDELINES FOR  LOW-CHOLESTEROL, LOW TRIGLYCERIDE DIETS    FOODS TO AVOID   MEATS, FISH Marbled beef, pork, bacon, sausage, and other pork products; fatty fowl (duck, goose); skin and fat of [place]  and chicken; processed meats; luncheon meats (salami, bologna); frankfurters and fast-food hamburgers (theyre loaded with fat); organ meats (kidneys, liver); canned fish packed in oil.  EGGS Limit egg yolks to two per week.   FRUITS Coconuts (rich in saturated fats).  VEGETABLES Avoid avocados. Starchy vegetables (potatoes, corn, lima beans, dried peas, beans) may be used only if substitutes for a serving of bread or cereal. (Baked potato skin, however, is desirable for its fiber content.  BEANS Commercial baked beans with sugar and/or pork added.  NUTS Avoid nuts.  Limit peanuts and walnuts to one tablespoonful per day.  BREADS, GRAINS Any baked goods with shortening and/or sugar. Commercial mixes with dried eggs and whole milk. Avoid sweet rolls, doughnuts, breakfast pastries (Danish), and sweetened packaged cereals (the added sugar converts readily to triglycerides).  MILK PRODUCTS Whole milk and whole-milk packaged goods; cream; ice cream; whole-milk puddings, yogurt, or cheeses; nondairy cream substitutes.  FATS, OILS Butter, lard, animal fats, bacon drippings, gravies, cream sauces as well as palm and coconut oils. All these are high in saturated fats. Examine labels on cholesterol free products for hydrogenated fats. (These are oils that have been hardened into solids and in  the process have become saturated.)  DESSERTS, SNACKS Fried snack foods like potato chips; chocolate; candies in general; jams, jellies, syrups; whole- milk puddings; ice cream and milk sherbets; hydrogenated peanut butter.  BEVERAGES Sugared fruit juices and soft drinks; cocoa made with whole milk and/or sugar. When using alcohol (1 oz liquor, 5 oz beer, or 2  oz dry table wine per serving), one serving must be substituted for one bread or cereal serving (limit, two servings of alcohol per day).   SPECIAL NOTES    Remember that even non-limited foods should be used in moderation. While on a cholesterol-lowering diet, be  sure to avoid animal fats and marbled meats. 3. While on a triglyceride-lowering diet, be sure to avoid sweets and to control the amount of carbohydrates you eat (starchy foods such as flour, bread, potatoes).While on a tri-glyceride-lowering diet, be sure to avoid sweets Buy a good low-fat cookbook, such as the one published by the American Heart Association. Consult your physician if you have any questions.               [facility] Low Glycemic Diet Plan   Low Glycemic Foods (20-49) Moderate Glycemic Foods (50-69) High Glycemic Foods (70-100)      Breakfast Creals Breakfast Cereals Breakfast Cereals  All Bran All-Bran Fruit'n Oats   Bran Buds Bran Chex   Cheerios Corn chex    Fiber One Oatmeal (not instant)   Just Right Mini-Wheats   Corn Flakes Cream of Wheat    Oat Bran Special K Swiss Muesli   Grape Nuts Grape Nut Flakes      Grits Nutri-Grain    Fruits and fruit juice: Fruits Puffed Rice Puffed Wheat    (Limit to 1-2 Servings per day) Banana (under-ride) Dates   Rice Chex Rice Krispies    Apples Apricots (fresh/dried)   Figs Grapes   Shredded Wheat Team    Blackberries Blueberries   Kiwi Mango   Total     Cherries Cranberries   Oranges Raisins     Peaches Pears    Fruits  Plums Prunes   Fruit Juices Pineapple Watermelon    Grapefruit Raspberries   Cranberry Juice Orange Juice   Banana (over-ripe)     Strawberries Tangerines      Apple Juice Grapefruit Juice   Beans and Legumes Beverages  Tomato Juice    [place]-type baked beans Sodas, sweet tea, pineapple juice   Canned pinto, kidney, or navy beans   Beans and Legumes (fresh-cooked) Green peas Vegetables  Black-eyed peas Butter Beans    Potato, baked, boiled, fried, mashed  Chick peas Lentils   Vegetables French fries  Green beans Lima beans   Beets Carrots   Canned or frozen corn  Kidney beans Navy beans   Sweet potato Yam   Parsnips  Pinto beans Snow peas   Corn on the cob  Winter squash      Non-starchy vegetables Grains Breads  Asparagus, avocado, broccoli, cabbage Cornmeal Rice, brown   Most breads (white and whole grain)  cauliflower, celery, cucumber, greens Rice, white Couscous   Bagels Bread sticks    lettuce, mushrooms, peppers, tomatoes  Bread stuffing Kaiser roll    okra, onions, spinach, summer squash Pasta Dinner rolls   [employer], cheese     Grains Ravioli, meat filled Spaghetti, white   Grains  Barley Bulgur    Rice, instant Tapioca, with milk    Rye Wild rice   Nuts    Cashews  Macadamia   Candy and most cookies  Nuts and oils    Almonds, peanuts, sunflower seeds Snacks Snacks  hazelnuts, pecans, walnuts Chocolate Ice cream, lowfat   Donuts Corn chips    Oils that are liquid at room temperature Muffin Popcorn   Jelly beans Pretzels      Pastries  Dairy, fish, meat, soy, and eggs    Milk, skim Lowfat cheese    Restaurant and ethnic foods  Yogurt, lowfat, fruit sugar sweetened  Most [place] food (sugar in stir fry    or wok sauce)  Lean red meat Fish    Teriyaki-style meats and vegetables  Skinless chicken and [place], shellfish        Egg whites (up to 3 daily), Soy Products    Egg yolks (up to 7 or _____ per week)

## 2022-06-19 LAB — COMPREHENSIVE METABOLIC PANEL
ALT: 13 IU/L (ref 0–32)
AST: 19 IU/L (ref 0–40)
Albumin/Globulin Ratio: 1.7 (ref 1.2–2.2)
Albumin: 4.3 g/dL (ref 3.8–4.9)
Alkaline Phosphatase: 68 IU/L (ref 44–121)
BUN/Creatinine Ratio: 14 (ref 9–23)
BUN: 13 mg/dL (ref 6–24)
Bilirubin Total: 0.3 mg/dL (ref 0.0–1.2)
CO2: 23 mmol/L (ref 20–29)
Calcium: 9 mg/dL (ref 8.7–10.2)
Chloride: 105 mmol/L (ref 96–106)
Creatinine, Ser: 0.9 mg/dL (ref 0.57–1.00)
Globulin, Total: 2.6 g/dL (ref 1.5–4.5)
Glucose: 82 mg/dL (ref 70–99)
Potassium: 4.3 mmol/L (ref 3.5–5.2)
Sodium: 142 mmol/L (ref 134–144)
Total Protein: 6.9 g/dL (ref 6.0–8.5)
eGFR: 77 mL/min/{1.73_m2} (ref >59)

## 2022-06-19 LAB — LIPID PANEL WITH LDL/HDL RATIO
Cholesterol, Total: 221 mg/dL — ABNORMAL HIGH (ref 100–199)
HDL: 52 mg/dL (ref >39)
LDL Chol Calc (NIH): 134 mg/dL — ABNORMAL HIGH (ref 0–99)
LDL/HDL Ratio: 2.6 ratio (ref 0.0–3.2)
Triglycerides: 197 mg/dL — ABNORMAL HIGH (ref 0–149)
VLDL Cholesterol Cal: 35 mg/dL (ref 5–40)

## 2022-07-21 ENCOUNTER — Other Ambulatory Visit: Payer: Self-pay

## 2022-07-21 ENCOUNTER — Encounter: Payer: Self-pay | Admitting: Family Medicine

## 2022-07-21 MED ORDER — EZETIMIBE 10 MG PO TABS: 10.0000 mg | ORAL_TABLET | Freq: Every day | ORAL | 0 refills | Status: DC

## 2022-08-03 ENCOUNTER — Telehealth: Payer: Self-pay | Admitting: *Deleted

## 2022-08-03 NOTE — Telephone Encounter (Signed)
Voicemail message left for patient to return my call.  

## 2022-08-03 NOTE — Telephone Encounter (Signed)
Patient called and asked if she can schedule just for colonoscopy only. Patient does not want to be seen for bloating, patient stated that she is doing better with that issue.  Please advise. Patient is currently schedule on 10/26/2022 for a new patient appointment.

## 2022-08-04 NOTE — Telephone Encounter (Signed)
Voicemail message left for patient to return my call.  

## 2022-08-10 NOTE — Telephone Encounter (Signed)
Will sent letter to patient.

## 2022-08-10 NOTE — Telephone Encounter (Signed)
Voicemail message left for patient to return my call.  

## 2022-09-02 ENCOUNTER — Other Ambulatory Visit: Payer: Self-pay | Admitting: *Deleted

## 2022-09-02 ENCOUNTER — Telehealth: Payer: Self-pay | Admitting: *Deleted

## 2022-09-02 DIAGNOSIS — Z1211 Encounter for screening for malignant neoplasm of colon: Secondary | ICD-10-CM

## 2022-09-02 MED ORDER — NA SULFATE-K SULFATE-MG SULF 17.5-3.13-1.6 GM/177ML PO SOLN: 1.0000 | Freq: Once | ORAL | 0 refills | Status: AC

## 2022-09-02 NOTE — Telephone Encounter (Signed)
Gastroenterology Pre-Procedure Review  Request Date: 09/19/2023 Requesting Physician: Dr. Allen Norris  PATIENT REVIEW QUESTIONS: The patient responded to the following health history questions as indicated:    1. Are you having any GI issues? yes (bloating but that has improved) 2. Do you have a personal history of Polyps? no 3. Do you have a family history of Colon Cancer or Polyps? no 4. Diabetes Mellitus? no 5. Joint replacements in the past 12 months?no 6. Major health problems in the past 3 months?no 7. Any artificial heart valves, MVP, or defibrillator?no    MEDICATIONS & ALLERGIES:    Patient reports the following regarding taking any anticoagulation/antiplatelet therapy:   Plavix, Coumadin, Eliquis, Xarelto, Lovenox, Pradaxa, Brilinta, or Effient? no Aspirin? no  Patient confirms/reports the following medications:  Current Outpatient Medications  Medication Sig Dispense Refill   Na Sulfate-K Sulfate-Mg Sulf 17.5-3.13-1.6 GM/177ML SOLN Take 1 kit by mouth once for 1 dose. 354 mL 0   amLODipine (NORVASC) 5 MG tablet Take 1 tablet (5 mg total) by mouth daily. kowalski 90 tablet 1   calcium citrate (CALCITRATE - DOSED IN MG ELEMENTAL CALCIUM) 950 (200 Ca) MG tablet Take 2 tablets by mouth daily.     cyclobenzaprine (FLEXERIL) 5 MG tablet Take 1-2 tablets (5-10 mg total) by mouth at bedtime as needed for muscle spasms. (Patient not taking: Reported on 12/02/2021) 30 tablet 0   ezetimibe (ZETIA) 10 MG tablet Take 1 tablet (10 mg total) by mouth daily. 90 tablet 0   gabapentin (NEURONTIN) 300 MG capsule Take 300-600 mg by mouth at bedtime as needed. (Patient not taking: Reported on 10/16/2021)     Ibuprofen 200 MG CAPS Take 800 mg by mouth as needed. (Patient not taking: Reported on 12/02/2021)     meloxicam (MOBIC) 7.5 MG tablet Take 1-2 tablets (7.5-15 mg total) by mouth daily as needed for pain. 60 tablet 0   metoprolol succinate (TOPROL-XL) 25 MG 24 hr tablet TAKE 1 TABLET(25 MG) BY MOUTH  DAILY 90 tablet 1   NIACIN ER PO Take 1 tablet by mouth daily.     Omega-3 Fatty Acids (FISH OIL CONCENTRATE PO) Take 2 capsules by mouth daily.     No current facility-administered medications for this visit.    Patient confirms/reports the following allergies:  Allergies  Allergen Reactions   Vitamin E Rash   Hydrochlorothiazide Other (See Comments)   Propranolol Other (See Comments)   Penicillins Rash    No orders of the defined types were placed in this encounter.   AUTHORIZATION INFORMATION Primary Insurance: 1D#: Group #:  Secondary Insurance: 1D#: Group #:  SCHEDULE INFORMATION: Date: 09/18/2022 Time: Location: [facility]

## 2022-09-08 ENCOUNTER — Telehealth: Payer: Self-pay | Admitting: *Deleted

## 2022-09-08 NOTE — Telephone Encounter (Signed)
Patient called office in need to reschedule colonoscopy which is on 09/18/2022 due husband's work.  Patient request to have reschedule to 10/23/2022.

## 2022-10-13 ENCOUNTER — Encounter: Payer: Self-pay | Admitting: Gastroenterology

## 2022-10-18 ENCOUNTER — Other Ambulatory Visit: Payer: Self-pay | Admitting: Family Medicine

## 2022-10-23 ENCOUNTER — Other Ambulatory Visit: Payer: Self-pay

## 2022-10-23 ENCOUNTER — Ambulatory Visit: Payer: BC Managed Care – PPO | Admitting: Anesthesiology

## 2022-10-23 ENCOUNTER — Ambulatory Visit
Admission: RE | Admit: 2022-10-23 | Discharge: 2022-10-23 | Disposition: A | Discharge status: Home / Self Care | Payer: BC Managed Care – PPO | Attending: Gastroenterology | Admitting: Gastroenterology

## 2022-10-23 ENCOUNTER — Encounter
Admission: RE | Disposition: A | Discharge status: Home / Self Care | Payer: Self-pay | Source: Home / Self Care | Attending: Gastroenterology

## 2022-10-23 ENCOUNTER — Encounter: Payer: Self-pay | Admitting: Gastroenterology

## 2022-10-23 DIAGNOSIS — I10 Essential (primary) hypertension: Secondary | ICD-10-CM | POA: Insufficient documentation

## 2022-10-23 DIAGNOSIS — D125 Benign neoplasm of sigmoid colon: Secondary | ICD-10-CM | POA: Insufficient documentation

## 2022-10-23 DIAGNOSIS — Z1211 Encounter for screening for malignant neoplasm of colon: Secondary | ICD-10-CM

## 2022-10-23 DIAGNOSIS — K635 Polyp of colon: Secondary | ICD-10-CM

## 2022-10-23 DIAGNOSIS — K640 First degree hemorrhoids: Secondary | ICD-10-CM | POA: Diagnosis not present

## 2022-10-23 HISTORY — DX: Motion sickness, initial encounter: T75.3XXA

## 2022-10-23 HISTORY — PX: COLONOSCOPY WITH PROPOFOL: SHX5780

## 2022-10-23 LAB — POCT PREGNANCY, URINE: Preg Test, Ur: NEGATIVE

## 2022-10-23 SURGERY — COLONOSCOPY WITH PROPOFOL
Anesthesia: General | Site: Rectum

## 2022-10-23 MED ORDER — STERILE WATER FOR IRRIGATION IR SOLN
Status: DC | PRN
  Administered 2022-10-23: 150 mL

## 2022-10-23 MED ORDER — SODIUM CHLORIDE 0.9 % IV SOLN: INTRAVENOUS | Status: DC

## 2022-10-23 MED ORDER — LACTATED RINGERS IV SOLN: INTRAVENOUS | Status: DC

## 2022-10-23 MED ORDER — PROPOFOL 10 MG/ML IV BOLUS
INTRAVENOUS | Status: DC | PRN
  Administered 2022-10-23: 200 ug/kg/min via INTRAVENOUS
  Administered 2022-10-23: 150 mg via INTRAVENOUS

## 2022-10-23 MED ORDER — LIDOCAINE HCL (CARDIAC) PF 100 MG/5ML IV SOSY
PREFILLED_SYRINGE | INTRAVENOUS | Status: DC | PRN
  Administered 2022-10-23: 100 mg via INTRAVENOUS

## 2022-10-23 SURGICAL SUPPLY — 21 items

## 2022-10-23 NOTE — Anesthesia Postprocedure Evaluation (Signed)
Anesthesia Post Note  Patient: Rebekah Vasquez  Procedure(s) Performed: COLONOSCOPY WITH PROPOFOL (Rectum)  Patient location during evaluation: PACU Anesthesia Type: General Level of consciousness: awake and alert Pain management: pain level controlled Vital Signs Assessment: post-procedure vital signs reviewed and stable Respiratory status: spontaneous breathing, nonlabored ventilation, respiratory function stable and patient connected to nasal cannula oxygen Cardiovascular status: blood pressure returned to baseline and stable Postop Assessment: no apparent nausea or vomiting Anesthetic complications: no  No notable events documented.   Last Vitals:  Vitals:   10/23/22 0851 10/23/22 0900  BP: 99/60 110/75  Pulse: 62   Resp: 18   Temp: (!) 36.1 C (!) 36.4 C  SpO2: 97%     Last Pain:  Vitals:   10/23/22 0900  TempSrc:   PainSc: 0-No pain                 Dimas Millin

## 2022-10-23 NOTE — H&P (Signed)
Lucilla Lame, MD [address] [place], [place] [address] Phone: [phone] Fax : [phone]  Primary Care Physician:  Juline Patch, MD Primary Gastroenterologist:  Dr. Allen Norris  Pre-Procedure History & Physical: HPI:  Moksha Z Kenagy is a 52 y.o. female is here for a screening colonoscopy.   Past Medical History:  Diagnosis Date   Hypertension    Motion sickness    Boats, planes    Past Surgical History:  Procedure Laterality Date   ABDOMINAL SURGERY      Prior to Admission medications   Medication Sig Start Date End Date Taking? Authorizing Provider  amLODipine (NORVASC) 5 MG tablet Take 1 tablet (5 mg total) by mouth daily. kowalski 06/18/22 06/18/23 Yes Juline Patch, MD  diphenhydrAMINE (BENADRYL) 25 mg capsule Take 25 mg by mouth at bedtime as needed.   Yes [provider]  ezetimibe (ZETIA) 10 MG tablet TAKE 1 TABLET BY MOUTH EVERY DAY 10/19/22  Yes Juline Patch, MD  metoprolol succinate (TOPROL-XL) 25 MG 24 hr tablet TAKE 1 TABLET(25 MG) BY MOUTH DAILY 06/18/22  Yes Juline Patch, MD  calcium citrate (CALCITRATE - DOSED IN MG ELEMENTAL CALCIUM) 950 (200 Ca) MG tablet Take 2 tablets by mouth daily. Patient not taking: Reported on 10/13/2022    [provider]  cyclobenzaprine (FLEXERIL) 5 MG tablet Take 1-2 tablets (5-10 mg total) by mouth at bedtime as needed for muscle spasms. Patient not taking: Reported on 12/02/2021 10/27/21   Montel Culver, MD  gabapentin (NEURONTIN) 300 MG capsule Take 300-600 mg by mouth at bedtime as needed. Patient not taking: Reported on 10/16/2021 09/02/21   [provider]  Ibuprofen 200 MG CAPS Take 800 mg by mouth as needed. Patient not taking: Reported on 12/02/2021    [provider]  meloxicam (MOBIC) 7.5 MG tablet Take 1-2 tablets (7.5-15 mg total) by mouth daily as needed for pain. Patient not taking: Reported on 10/13/2022 12/02/21   Montel Culver, MD  NIACIN ER PO Take 1 tablet by  mouth daily. Patient not taking: Reported on 10/13/2022    [provider]  Omega-3 Fatty Acids (FISH OIL CONCENTRATE PO) Take 2 capsules by mouth daily. Patient not taking: Reported on 10/13/2022    [provider]    Allergies as of 09/02/2022 - Review Complete 01/01/2022  Allergen Reaction Noted   Vitamin e Rash 02/07/2014   Hydrochlorothiazide Other (See Comments) 05/20/2020   Propranolol Other (See Comments) 05/20/2020   Penicillins Rash 02/26/2016    History reviewed. No pertinent family history.  Social History   Socioeconomic History   Marital status: Married    Spouse name: Madysyn Hanken   Number of children: Not on file   Years of education: Not on file   Highest education level: Not on file  Occupational History   Not on file  Tobacco Use   Smoking status: Never   Smokeless tobacco: Never  Vaping Use   Vaping Use: Never used  Substance and Sexual Activity   Alcohol use: Yes    Alcohol/week: 6.0 standard drinks of alcohol    Types: 6 Standard drinks or equivalent per week   Drug use: Never   Sexual activity: Yes    Partners: Male  Other Topics Concern   Not on file  Social History Narrative   Not on file   Social Determinants of Health   Financial Resource Strain: Not on file  Food Insecurity: Not on file  Transportation Needs: Not on file  Physical Activity: Not on file  Stress: Not on file  Social Connections: Not on file  Intimate Partner Violence: Not on file    Review of Systems: See HPI, otherwise negative ROS  Physical Exam: BP 106/88   Pulse 67   Temp (!) 97.5 F (36.4 C) (Temporal)   Resp 18   Ht '5\' 7"'$  (1.702 m)   Wt 73.5 kg   LMP 09/28/2022 (Approximate) Comment: u preg neg  SpO2 97%   BMI 25.37 kg/m  General:   Alert,  pleasant and cooperative in NAD Head:  Normocephalic and atraumatic. Neck:  Supple; no masses or thyromegaly. Lungs:  Clear throughout to auscultation.    Heart:  Regular rate and  rhythm. Abdomen:  Soft, nontender and nondistended. Normal bowel sounds, without guarding, and without rebound.   Neurologic:  Alert and  oriented x4;  grossly normal neurologically.  Impression/Plan: Azyriah Z Deschene is now here to undergo a screening colonoscopy.  Risks, benefits, and alternatives regarding colonoscopy have been reviewed with the patient.  Questions have been answered.  All parties agreeable.

## 2022-10-23 NOTE — Transfer of Care (Signed)
Immediate Anesthesia Transfer of Care Note  Patient: Rebekah Vasquez  Procedure(s) Performed: COLONOSCOPY WITH PROPOFOL (Rectum)  Patient Location: PACU  Anesthesia Type:General  Level of Consciousness: awake and sedated  Airway & Oxygen Therapy: Patient Spontanous Breathing  Post-op Assessment: Report given to RN  Post vital signs: Reviewed and stable  Last Vitals: See flow sheet for normal temp Vitals Value Taken Time  BP 99/60 10/23/22 0852  Temp    Pulse 61 10/23/22 0854  Resp 14 10/23/22 0854  SpO2 98 % 10/23/22 0854  Vitals shown include unvalidated device data.  Last Pain:  Vitals:   10/23/22 0739  TempSrc: Temporal  PainSc: 0-No pain         Complications: No notable events documented.

## 2022-10-23 NOTE — Anesthesia Preprocedure Evaluation (Signed)
Anesthesia Evaluation  Patient identified by MRN, date of birth, ID band Patient awake    Reviewed: Allergy & Precautions, NPO status , Patient's Chart, lab work & pertinent test results  Airway Mallampati: III  TM Distance: >3 FB Neck ROM: full    Dental  (+) Chipped   Pulmonary neg pulmonary ROS, neg shortness of breath, neg COPD   Pulmonary exam normal        Cardiovascular hypertension, (-) angina negative cardio ROS Normal cardiovascular exam     Neuro/Psych negative neurological ROS  negative psych ROS   GI/Hepatic negative GI ROS, Neg liver ROS,,,  Endo/Other  negative endocrine ROS    Renal/GU negative Renal ROS  negative genitourinary   Musculoskeletal   Abdominal   Peds  Hematology negative hematology ROS (+)   Anesthesia Other Findings Past Medical History: No date: Hypertension No date: Motion sickness     Comment:  Boats, planes  Past Surgical History: No date: ABDOMINAL SURGERY  BMI    Body Mass Index: 25.37 kg/m      Reproductive/Obstetrics negative OB ROS                             Anesthesia Physical Anesthesia Plan  ASA: 2  Anesthesia Plan: General   Post-op Pain Management: Minimal or no pain anticipated   Induction: Intravenous  PONV Risk Score and Plan: 3 and Propofol infusion, TIVA and Ondansetron  Airway Management Planned: Nasal Cannula  Additional Equipment: None  Intra-op Plan:   Post-operative Plan:   Informed Consent: I have reviewed the patients History and Physical, chart, labs and discussed the procedure including the risks, benefits and alternatives for the proposed anesthesia with the patient or authorized representative who has indicated his/her understanding and acceptance.     Dental advisory given  Plan Discussed with: CRNA and Surgeon  Anesthesia Plan Comments: (Discussed risks of anesthesia with patient, including  possibility of difficulty with spontaneous ventilation under anesthesia necessitating airway intervention, PONV, and rare risks such as cardiac or respiratory or neurological events, and allergic reactions. Discussed the role of CRNA in patient's perioperative care. Patient understands.)       Anesthesia Quick Evaluation

## 2022-10-23 NOTE — Op Note (Signed)
[facility] Patient Name: Rebekah Vasquez Procedure Date: 10/23/2022 8:16 AM MRN: [id number] Account #: [id number] Date of Birth: [birth year] Admit Type: Outpatient Age: 52 Room: [facility] OR ROOM 01 Gender: Female Note Status: Finalized Instrument Name: [id number] Procedure:             Colonoscopy Indications:           Screening for colorectal malignant neoplasm Providers:             Lucilla Lame MD, MD Referring MD:          Juline Patch, MD (Referring MD) Medicines:             Propofol per Anesthesia Complications:         No immediate complications. Procedure:             Pre-Anesthesia Assessment:                        - Prior to the procedure, a History and Physical was                         performed, and patient medications and allergies were                         reviewed. The patient's tolerance of previous                         anesthesia was also reviewed. The risks and benefits                         of the procedure and the sedation options and risks                         were discussed with the patient. All questions were                         answered, and informed consent was obtained. Prior                         Anticoagulants: The patient has taken no anticoagulant                         or antiplatelet agents. ASA Grade Assessment: II - A                         patient with mild systemic disease. After reviewing                         the risks and benefits, the patient was deemed in                         satisfactory condition to undergo the procedure.                        After obtaining informed consent, the colonoscope was                         passed under direct vision. Throughout the procedure,  the patient's blood pressure, pulse, and oxygen                         saturations were monitored continuously. The                         Colonoscope was introduced through the anus and                          advanced to the the cecum, identified by appendiceal                         orifice and ileocecal valve. The colonoscopy was                         performed without difficulty. The patient tolerated                         the procedure well. The quality of the bowel                         preparation was excellent. Findings:      The perianal and digital rectal examinations were normal.      Four sessile polyps were found in the sigmoid colon. The polyps were 2       to 5 mm in size. These polyps were removed with a cold snare. Resection       and retrieval were complete.      Multiple small-mouthed diverticula were found in the sigmoid colon.      Non-bleeding internal hemorrhoids were found during retroflexion. The       hemorrhoids were Grade I (internal hemorrhoids that do not prolapse). Impression:            - Four 2 to 5 mm polyps in the sigmoid colon, removed                         with a cold snare. Resected and retrieved.                        - Diverticulosis in the sigmoid colon.                        - Non-bleeding internal hemorrhoids. Recommendation:        - Discharge patient to home.                        - Resume previous diet.                        - Continue present medications.                        - Await pathology results.                        - If the pathology report reveals adenomatous tissue,                         then repeat the colonoscopy for surveillance in 5  years. Procedure Code(s):     --- Professional ---                        [phone], Colonoscopy, flexible; with removal of                         tumor(s), polyp(s), or other lesion(s) by snare                         technique Diagnosis Code(s):     --- Professional ---                        Z12.11, Encounter for screening for malignant neoplasm                         of colon                        D12.5, Benign neoplasm of sigmoid  colon CPT copyright 2022 [employer]. All rights reserved. The codes documented in this report are preliminary and upon coder review may  be revised to meet current compliance requirements. Lucilla Lame MD, MD 10/23/2022 8:51:24 AM This report has been signed electronically. Number of Addenda: 0 Note Initiated On: 10/23/2022 8:16 AM Scope Withdrawal Time: 0 hours 8 minutes 24 seconds  Total Procedure Duration: 0 hours 16 minutes 47 seconds  Estimated Blood Loss:  Estimated blood loss: none.      [facility]

## 2022-10-26 ENCOUNTER — Ambulatory Visit: Payer: BC Managed Care – PPO | Admitting: Gastroenterology

## 2022-10-26 ENCOUNTER — Encounter: Payer: Self-pay | Admitting: Gastroenterology

## 2022-10-26 LAB — SURGICAL PATHOLOGY

## 2022-10-29 ENCOUNTER — Encounter: Payer: Self-pay | Admitting: Gastroenterology

## 2022-11-10 ENCOUNTER — Other Ambulatory Visit: Payer: Self-pay | Admitting: Family Medicine

## 2022-11-11 NOTE — Telephone Encounter (Signed)
Requested medication (s) are due for refill today: yes  Requested medication (s) are on the active medication list: yes  Last refill:  10/19/22  Future visit scheduled: yes  Notes to clinic:  Unable to refill per protocol, courtesy refill already given, routing for provider approval.      Requested Prescriptions  Pending Prescriptions Disp Refills   ezetimibe (ZETIA) 10 MG tablet [Pharmacy Med Name: EZETIMIBE 10 MG TABLET] 90 tablet 1    Sig: TAKE 1 TABLET BY MOUTH EVERY DAY     Cardiovascular:  Antilipid - Sterol Transport Inhibitors Failed - 11/10/2022  8:30 AM      Failed - Lipid Panel in normal range within the last 12 months    Cholesterol, Total  Date Value Ref Range Status  06/18/2022 221 (H) 100 - 199 mg/dL Final   LDL Chol Calc (NIH)  Date Value Ref Range Status  06/18/2022 134 (H) 0 - 99 mg/dL Final   HDL  Date Value Ref Range Status  06/18/2022 52 >39 mg/dL Final   Triglycerides  Date Value Ref Range Status  06/18/2022 197 (H) 0 - 149 mg/dL Final         Passed - AST in normal range and within 360 days    AST  Date Value Ref Range Status  06/18/2022 19 0 - 40 IU/L Final         Passed - ALT in normal range and within 360 days    ALT  Date Value Ref Range Status  06/18/2022 13 0 - 32 IU/L Final         Passed - Patient is not pregnant      Passed - Valid encounter within last 12 months    Recent Outpatient Visits           4 months ago Essential hypertension   [place] [facility] [place], Deanna C, MD   11 months ago Cervical paraspinal muscle spasm   [place] [facility] [place], Earley Abide, MD   1 year ago Cervical paraspinal muscle spasm   [place] [facility] [place], Earley Abide, MD   1 year ago Essential hypertension   [place] at [place], Deanna C, MD   1 year ago  Essential hypertension   [place] at [place], [place], MD       Future Appointments             In 1 month Juline Patch, MD [place] at [facility], [facility]

## 2022-11-23 ENCOUNTER — Other Ambulatory Visit: Payer: Self-pay | Admitting: Family Medicine

## 2022-11-24 NOTE — Telephone Encounter (Signed)
Appointment 12/17/22- courtesy 30 day Rx given Requested Prescriptions  Pending Prescriptions Disp Refills   ezetimibe (ZETIA) 10 MG tablet [Pharmacy Med Name: EZETIMIBE 10 MG TABLET] 30 tablet 0    Sig: TAKE 1 TABLET BY MOUTH EVERY DAY     Cardiovascular:  Antilipid - Sterol Transport Inhibitors Failed - 11/23/2022  9:55 PM      Failed - Lipid Panel in normal range within the last 12 months    Cholesterol, Total  Date Value Ref Range Status  06/18/2022 221 (H) 100 - 199 mg/dL Final   LDL Chol Calc (NIH)  Date Value Ref Range Status  06/18/2022 134 (H) 0 - 99 mg/dL Final   HDL  Date Value Ref Range Status  06/18/2022 52 >39 mg/dL Final   Triglycerides  Date Value Ref Range Status  06/18/2022 197 (H) 0 - 149 mg/dL Final         Passed - AST in normal range and within 360 days    AST  Date Value Ref Range Status  06/18/2022 19 0 - 40 IU/L Final         Passed - ALT in normal range and within 360 days    ALT  Date Value Ref Range Status  06/18/2022 13 0 - 32 IU/L Final         Passed - Patient is not pregnant      Passed - Valid encounter within last 12 months    Recent Outpatient Visits           5 months ago Essential hypertension   [place] [facility] [place] [facility] [place], Deanna C, MD   11 months ago Cervical paraspinal muscle spasm   [facility] [place], Earley Abide, MD   1 year ago Cervical paraspinal muscle spasm    [place] [facility] [place], Earley Abide, MD   1 year ago Essential hypertension   [place] [facility] [place], Deanna C, MD   1 year ago Essential hypertension   [place] [facility] [place], [place], MD       Future Appointments             In 3 weeks Juline Patch, MD [place] at [facility], [facility]

## 2022-12-17 ENCOUNTER — Ambulatory Visit: Payer: BC Managed Care – PPO | Admitting: Family Medicine

## 2022-12-21 ENCOUNTER — Other Ambulatory Visit: Payer: Self-pay | Admitting: Family Medicine

## 2022-12-22 NOTE — Telephone Encounter (Signed)
Requested Prescriptions  Pending Prescriptions Disp Refills   ezetimibe (ZETIA) 10 MG tablet [Pharmacy Med Name: EZETIMIBE 10 MG TABLET] 90 tablet 0    Sig: TAKE 1 TABLET BY MOUTH EVERY DAY     Cardiovascular:  Antilipid - Sterol Transport Inhibitors Failed - 12/21/2022  1:31 PM      Failed - Lipid Panel in normal range within the last 12 months    Cholesterol, Total  Date Value Ref Range Status  06/18/2022 221 (H) 100 - 199 mg/dL Final   LDL Chol Calc (NIH)  Date Value Ref Range Status  06/18/2022 134 (H) 0 - 99 mg/dL Final   HDL  Date Value Ref Range Status  06/18/2022 52 >39 mg/dL Final   Triglycerides  Date Value Ref Range Status  06/18/2022 197 (H) 0 - 149 mg/dL Final         Passed - AST in normal range and within 360 days    AST  Date Value Ref Range Status  06/18/2022 19 0 - 40 IU/L Final         Passed - ALT in normal range and within 360 days    ALT  Date Value Ref Range Status  06/18/2022 13 0 - 32 IU/L Final         Passed - Patient is not pregnant      Passed - Valid encounter within last 12 months    Recent Outpatient Visits           6 months ago Essential hypertension   [place] [facility] [place], Deanna C, MD   1 year ago Cervical paraspinal muscle spasm   [place] [facility] [place], Earley Abide, MD   1 year ago Cervical paraspinal muscle spasm   [place] [facility] [place], Earley Abide, MD   1 year ago Essential hypertension   [place] [facility] [place], Deanna C, MD   1 year ago Essential hypertension   [place] [facility] [place], [place], MD       Future Appointments             In 1 week Juline Patch, MD [place] at [facility], [facility]

## 2022-12-28 ENCOUNTER — Other Ambulatory Visit: Payer: Self-pay | Admitting: Family Medicine

## 2022-12-28 DIAGNOSIS — I10 Essential (primary) hypertension: Secondary | ICD-10-CM

## 2022-12-31 ENCOUNTER — Ambulatory Visit: Payer: BC Managed Care – PPO | Admitting: Family Medicine

## 2022-12-31 ENCOUNTER — Encounter: Payer: Self-pay | Admitting: Family Medicine

## 2022-12-31 VITALS — BP 120/76 | HR 59 | Ht 67.0 in | Wt 166.0 lb

## 2022-12-31 DIAGNOSIS — I10 Essential (primary) hypertension: Secondary | ICD-10-CM | POA: Diagnosis not present

## 2022-12-31 DIAGNOSIS — E785 Hyperlipidemia, unspecified: Secondary | ICD-10-CM | POA: Diagnosis not present

## 2022-12-31 MED ORDER — AMLODIPINE BESYLATE 5 MG PO TABS: 5.0000 mg | ORAL_TABLET | Freq: Every day | ORAL | 1 refills | Status: AC

## 2022-12-31 MED ORDER — METOPROLOL SUCCINATE ER 25 MG PO TB24: ORAL_TABLET | ORAL | 1 refills | Status: AC

## 2022-12-31 MED ORDER — EZETIMIBE 10 MG PO TABS: 10.0000 mg | ORAL_TABLET | Freq: Every day | ORAL | 1 refills | Status: DC

## 2022-12-31 NOTE — Progress Notes (Signed)
Date:  12/31/2022   Name:  Rebekah Vasquez   DOB:  March 29, 1971   MRN:  [id number]   Chief Complaint: Hypertension  Hypertension This is a chronic problem. The current episode started more than 1 year ago. The problem has been gradually improving since onset. The problem is controlled. Pertinent negatives include no blurred vision, chest pain, headaches, orthopnea, palpitations, peripheral edema, PND or shortness of breath. There are no associated agents to hypertension. Past treatments include calcium channel blockers and beta blockers. The current treatment provides moderate improvement. There are no compliance problems.  There is no history of CAD/MI or CVA. There is no history of chronic renal disease, a hypertension causing med or renovascular disease.  Hyperlipidemia She has no history of chronic renal disease. Pertinent negatives include no chest pain or shortness of breath. Current antihyperlipidemic treatment includes ezetimibe. The current treatment provides moderate improvement of lipids.    Lab Results  Component Value Date   NA 142 06/18/2022   K 4.3 06/18/2022   CO2 23 06/18/2022   GLUCOSE 82 06/18/2022   BUN 13 06/18/2022   CREATININE 0.90 06/18/2022   CALCIUM 9.0 06/18/2022   EGFR 77 06/18/2022   Lab Results  Component Value Date   CHOL 221 (H) 06/18/2022   HDL 52 06/18/2022   LDLCALC 134 (H) 06/18/2022   TRIG 197 (H) 06/18/2022   No results found for: "TSH" No results found for: "HGBA1C" No results found for: "WBC", "HGB", "HCT", "MCV", "PLT" Lab Results  Component Value Date   ALT 13 06/18/2022   AST 19 06/18/2022   ALKPHOS 68 06/18/2022   BILITOT 0.3 06/18/2022   No results found for: "25OHVITD2", "25OHVITD3", "VD25OH"   Review of Systems  Constitutional:  Negative for diaphoresis, fatigue and unexpected weight change.  HENT:  Negative for trouble swallowing.   Eyes:  Negative for blurred vision and visual disturbance.  Respiratory:  Negative for cough,  chest tightness, shortness of breath and wheezing.   Cardiovascular:  Negative for chest pain, palpitations, orthopnea, leg swelling and PND.  Gastrointestinal:  Negative for abdominal pain, blood in stool, constipation, diarrhea and nausea.  Endocrine: Negative for polydipsia and polyuria.  Neurological:  Negative for headaches.    Patient Active Problem List   Diagnosis Date Noted   Screening for colon cancer 10/23/2022   Polyp of sigmoid colon 10/23/2022   Cervical paraspinal muscle spasm 10/27/2021   Strain of left supraspinatus muscle 10/27/2021   Benign essential HTN 11/11/2018   Stable angina pectoris 11/11/2018   Premenstrual tension syndrome 11/10/2018   Migraine 09/28/2005    Allergies  Allergen Reactions   Vitamin E Rash   Hydrochlorothiazide Other (See Comments)    Dizziness   Propranolol Other (See Comments)    Dizziness   Penicillins Rash    Past Surgical History:  Procedure Laterality Date   ABDOMINAL SURGERY     COLONOSCOPY WITH PROPOFOL N/A 10/23/2022   Procedure: COLONOSCOPY WITH PROPOFOL;  Surgeon: Lucilla Lame, MD;  Location: [place];  Service: Endoscopy;  Laterality: N/A;    Social History   Tobacco Use   Smoking status: Never   Smokeless tobacco: Never  Vaping Use   Vaping Use: Never used  Substance Use Topics   Alcohol use: Yes    Alcohol/week: 6.0 standard drinks of alcohol    Types: 6 Standard drinks or equivalent per week   Drug use: Never     Medication list has been reviewed and updated.  Current Meds  Medication Sig   amLODipine (NORVASC) 5 MG tablet Take 1 tablet (5 mg total) by mouth daily. kowalski   diphenhydrAMINE (BENADRYL) 25 mg capsule Take 25 mg by mouth at bedtime as needed.   ezetimibe (ZETIA) 10 MG tablet TAKE 1 TABLET BY MOUTH EVERY DAY   metoprolol succinate (TOPROL-XL) 25 MG 24 hr tablet TAKE 1 TABLET(25 MG) BY MOUTH DAILY       12/31/2022    9:23 AM 06/18/2022   10:40 AM 12/02/2021    3:22 PM 10/27/2021     3:36 PM  GAD 7 : Generalized Anxiety Score  Nervous, Anxious, on Edge 0 1 1 1   Control/stop worrying 0 0 0 1  Worry too much - different things 0 0 1 1  Trouble relaxing 0 0 0 1  Restless 0 0 0 0  Easily annoyed or irritable 0 3 1 1   Afraid - awful might happen 0 0 0 0  Total GAD 7 Score 0 4 3 5   Anxiety Difficulty Not difficult at all Not difficult at all Not difficult at all Not difficult at all       12/31/2022    9:23 AM 06/18/2022   10:36 AM 12/02/2021    3:22 PM  Depression screen PHQ 2/9  Decreased Interest 0 0 1  Down, Depressed, Hopeless 0 1 0  PHQ - 2 Score 0 1 1  Altered sleeping 0 0 0  Tired, decreased energy 0 1 1  Change in appetite 0  0  Feeling bad or failure about yourself  0 1 0  Trouble concentrating 0 0 0  Moving slowly or fidgety/restless 0 0 0  Suicidal thoughts 0 0 0  PHQ-9 Score 0 3 2  Difficult doing work/chores Not difficult at all Not difficult at all Not difficult at all    BP Readings from Last 3 Encounters:  12/31/22 120/76  10/23/22 110/75  06/18/22 120/80    Physical Exam Vitals and nursing note reviewed. Exam conducted with a chaperone present.  Constitutional:      General: She is not in acute distress.    Appearance: She is not diaphoretic.  HENT:     Head: Normocephalic and atraumatic.     Right Ear: Tympanic membrane and external ear normal.     Left Ear: Tympanic membrane and external ear normal.     Nose: No congestion or rhinorrhea.     Mouth/Throat:     Mouth: Mucous membranes are moist.  Eyes:     Pupils: Pupils are equal, round, and reactive to light.  Neck:     Thyroid: No thyromegaly.     Vascular: No JVD.  Cardiovascular:     Rate and Rhythm: Normal rate and regular rhythm.     Heart sounds: Normal heart sounds, S1 normal and S2 normal. No murmur heard.    No systolic murmur is present.     No diastolic murmur is present.     No friction rub. No gallop. No S3 or S4 sounds.  Pulmonary:     Effort: Pulmonary  effort is normal.     Breath sounds: Normal breath sounds. No wheezing, rhonchi or rales.  Abdominal:     General: Bowel sounds are normal.     Palpations: Abdomen is soft. There is no mass.     Tenderness: There is no abdominal tenderness. There is no guarding.  Musculoskeletal:     Cervical back: Neck supple.  Lymphadenopathy:  Cervical: No cervical adenopathy.  Skin:    General: Skin is warm and dry.  Neurological:     Mental Status: She is alert.     Wt Readings from Last 3 Encounters:  12/31/22 166 lb (75.3 kg)  10/23/22 162 lb (73.5 kg)  06/18/22 165 lb (74.8 kg)    BP 120/76   Pulse (!) 59   Ht 5\' 7"  (1.702 m)   Wt 166 lb (75.3 kg)   LMP 12/09/2022 (Exact Date)   SpO2 99%   BMI 26.00 kg/m   Assessment and Plan: 1. Essential hypertension Chronic.  Controlled.  Stable.  Blood pressure 120/76.  Tolerating medications well.  Asymptomatic.  Continue metoprolol XL 25 mg once a day and amlodipine 5 mg once a day.  Will check renal function panel for electrolytes and GFR.  Will recheck in 6 months. - metoprolol succinate (TOPROL-XL) 25 MG 24 hr tablet; TAKE 1 TABLET(25 MG) BY MOUTH DAILY  Dispense: 90 tablet; Refill: 1 - amLODipine (NORVASC) 5 MG tablet; Take 1 tablet (5 mg total) by mouth daily. kowalski  Dispense: 90 tablet; Refill: 1 - Renal Function Panel  2. Dyslipidemia Chronic.  Controlled.  Stable.  Tolerating Zetia very well without issues.  Review of previous lipids in January were excellent.  Will continue at current dosing and will recheck in 6 months. - ezetimibe (ZETIA) 10 MG tablet; Take 1 tablet (10 mg total) by mouth daily.  Dispense: 90 tablet; Refill: 1     Otilio Miu, MD

## 2023-01-01 LAB — RENAL FUNCTION PANEL
Albumin: 4.4 g/dL (ref 3.8–4.9)
BUN/Creatinine Ratio: 10 (ref 9–23)
BUN: 9 mg/dL (ref 6–24)
CO2: 22 mmol/L (ref 20–29)
Calcium: 8.8 mg/dL (ref 8.7–10.2)
Chloride: 100 mmol/L (ref 96–106)
Creatinine, Ser: 0.93 mg/dL (ref 0.57–1.00)
Glucose: 91 mg/dL (ref 70–99)
Phosphorus: 3.3 mg/dL (ref 3.0–4.3)
Potassium: 3.7 mmol/L (ref 3.5–5.2)
Sodium: 141 mmol/L (ref 134–144)
eGFR: 74 mL/min/{1.73_m2} (ref >59)

## 2023-06-25 ENCOUNTER — Ambulatory Visit: Payer: BC Managed Care – PPO | Admitting: Family Medicine

## 2023-07-01 ENCOUNTER — Ambulatory Visit: Payer: BC Managed Care – PPO | Admitting: Family Medicine

## 2023-07-09 ENCOUNTER — Ambulatory Visit: Payer: BC Managed Care – PPO | Admitting: Family Medicine

## 2023-09-23 ENCOUNTER — Encounter: Payer: Self-pay | Admitting: Family Medicine

## 2023-09-23 ENCOUNTER — Ambulatory Visit: Payer: BC Managed Care – PPO | Admitting: Family Medicine

## 2023-09-23 VITALS — BP 124/78 | HR 74 | Ht 67.0 in | Wt 163.8 lb

## 2023-09-23 DIAGNOSIS — L089 Local infection of the skin and subcutaneous tissue, unspecified: Secondary | ICD-10-CM

## 2023-09-23 DIAGNOSIS — W5503XA Scratched by cat, initial encounter: Secondary | ICD-10-CM | POA: Diagnosis not present

## 2023-09-23 MED ORDER — AZITHROMYCIN 250 MG PO TABS: ORAL_TABLET | ORAL | 1 refills | Status: AC

## 2023-09-23 MED ORDER — MUPIROCIN 2 % EX OINT: 1.0000 | TOPICAL_OINTMENT | Freq: Two times a day (BID) | CUTANEOUS | 0 refills | Status: AC

## 2023-09-23 NOTE — Patient Instructions (Signed)
Cat-Scratch Disease, Adult Cat-scratch disease is a bacterial infection that is spread to humans through a bite or scratch from an infected cat. The infection can also be spread by having contact with an infected cat and then touching your eyes or mouth. It is sometimes called cat-scratch fever. The infection does not spread from person to person (is not contagious). What are the causes? This condition is caused by bacteria called Bartonella henselae. These bacteria are carried by fleas. Cats get infected from flea bites or flea droppings. The bacteria may be present in the mouth or on the claws of cats or kittens, especially those that are younger than 53 year old. Cats do not look or act sick when they have this infection. What increases the risk? You are more likely to develop this condition if: You own or interact with a cat. You have a weakened body defense system (immune system). Your immune system may be weakened if: You are pregnant. You have certain conditions like cancer, HIV, or AIDS. You received a donated organ (transplant). What are the signs or symptoms? Common symptoms of this condition include: A bite or scratch that does not heal. A red bump near the wound. The bump may be red, warm, and tender to the touch. Other symptoms may take a few weeks to develop. They may include: Swollen, tender glands (lymph nodes) in the neck or under the arms. Fever. Rash. Joint pain. Headache. Low energy(listlessness). Loss of appetite. How is this diagnosed? This condition is diagnosed based on your symptoms and your history of a scratch or bite from a cat. Your health care provider will examine your skin and look for swollen lymph nodes. You may have tests, such as: A culture test. This involves testing a sample of fluid or pus from your wound. Blood tests. A lymph node biopsy. This involves removing and testing a tissue sample from a swollen lymph node. How is this treated? Cat-scratch  disease usually goes away without treatment in 2-4 months. However, a more severe infection may require antibiotic medicine if: You have other illnesses or problems that weaken the immune system. Your symptoms cause discomfort. If you have a painful, swollen lymph node, it may need to be drained with a needle. This is rare. Follow these instructions at home: Medicines Take over-the-counter and prescription medicines only as told by your health care provider. If you were prescribed an antibiotic medicine, take it as told by your health care provider. Do not stop taking the antibiotic even if you start to feel better. General instructions Return to your normal activities as told by your health care provider. Ask your health care provider what activities are safe for you. Apply warm compresses to the area as told your health care provider. Check your wound or swollen lymph node areas every day for signs of infection. Check for: Redness, swelling, or pain. Fluid or blood. Warmth. Pus or a bad smell. Keep all follow-up visits. This is important. How is this prevented? Avoid playing roughly or taking food away from a cat. Wash your hands well after you have contact with a cat. If you get scratched or bitten, wash the injured area as soon as possible with warm water and soap. Do not let a cat lick your skin if you have any cuts, sores, or scratches. Do not pick up or play with stray cats. If you have an indoor cat: Do not let your cat outside. Having contact with stray cats may make your cat more  likely to have contact with the bacteria. Trim your cat's nails. Check your cat for fleas. Ask your veterinarian which flea and tick repellent is safe to use for you and your cat. Contact a health care provider if: You have signs of infection in your wound or lymph nodes, such as: Redness, swelling, or pain. Fluid or blood. Warmth. Pus or bad smell. You have a fever. Your symptoms get worse or do  not get better. You have pain in your bones. Get help right away if: You develop pain in your abdomen. Your skin rash spreads. You feel dizzy or you faint. You develop inflammation of your eye or vision problems. You develop a stiff neck. These symptoms may represent a serious problem that is an emergency. Do not wait to see if the symptoms will go away. Get medical help right away. Call your local emergency services (911 in the [place] Do not drive yourself to the hospital. Summary Cat-scratch disease, sometimes called cat-scratch fever, is a bacterial infection that is caused by a bite or scratch from an infected cat. The infection can cause a red bump near the wound, swollen lymph nodes, and flu-like symptoms. Bacteria that cause this condition may be present in the mouth or on the claws of cats or kittens, especially those that are younger than 53 year old. If you were prescribed an antibiotic medicine, take it as told by your health care provider. Do not stop taking the antibiotic even if you start to feel better. This information is not intended to replace advice given to you by your health care provider. Make sure you discuss any questions you have with your health care provider. Document Revised: 01/30/2021 Document Reviewed: 01/30/2021 Elsevier Patient Education  2024 [employer].

## 2023-09-23 NOTE — Progress Notes (Signed)
Date:  09/23/2023   Name:  Rebekah Vasquez   DOB:  [birth year]   MRN:  [id number]   Chief Complaint: Rash (Pt in clinic today c/o itchy and burning rash on chest)  Rash This is a new problem. The current episode started in the past 7 days. The problem is unchanged. The affected locations include the chest. The rash is characterized by burning, redness and itchiness. Associated with: feral cat. Pertinent negatives include no cough, fatigue, fever, shortness of breath or sore throat. Past treatments include nothing.    Lab Results  Component Value Date   NA 141 12/31/2022   K 3.7 12/31/2022   CO2 22 12/31/2022   GLUCOSE 91 12/31/2022   BUN 9 12/31/2022   CREATININE 0.93 12/31/2022   CALCIUM 8.8 12/31/2022   EGFR 74 12/31/2022   Lab Results  Component Value Date   CHOL 221 (H) 06/18/2022   HDL 52 06/18/2022   LDLCALC 134 (H) 06/18/2022   TRIG 197 (H) 06/18/2022   No results found for: "TSH" No results found for: "HGBA1C" No results found for: "WBC", "HGB", "HCT", "MCV", "PLT" Lab Results  Component Value Date   ALT 13 06/18/2022   AST 19 06/18/2022   ALKPHOS 68 06/18/2022   BILITOT 0.3 06/18/2022   No results found for: "25OHVITD2", "25OHVITD3", "VD25OH"   Review of Systems  Constitutional:  Negative for fatigue and fever.  HENT:  Negative for sore throat.   Respiratory:  Negative for cough, choking, chest tightness, shortness of breath and wheezing.   Cardiovascular:  Negative for chest pain, palpitations and leg swelling.  Skin:  Positive for rash.    Patient Active Problem List   Diagnosis Date Noted   Screening for colon cancer 10/23/2022   Polyp of sigmoid colon 10/23/2022   Cervical paraspinal muscle spasm 10/27/2021   Strain of left supraspinatus muscle 10/27/2021   Benign essential HTN 11/11/2018   Stable angina pectoris (HCC) 11/11/2018   Premenstrual tension syndrome 11/10/2018   Migraine 09/28/2005    Allergies  Allergen Reactions   Vitamin E Rash    Hydrochlorothiazide Other (See Comments)    Dizziness   Propranolol Other (See Comments)    Dizziness   Penicillins Rash    Past Surgical History:  Procedure Laterality Date   ABDOMINAL SURGERY     COLONOSCOPY WITH PROPOFOL N/A 10/23/2022   Procedure: COLONOSCOPY WITH PROPOFOL;  Surgeon: Midge Minium, MD;  Location: [facility];  Service: Endoscopy;  Laterality: N/A;    Social History   Tobacco Use   Smoking status: Never   Smokeless tobacco: Never  Vaping Use   Vaping status: Never Used  Substance Use Topics   Alcohol use: Yes    Alcohol/week: 6.0 standard drinks of alcohol    Types: 6 Standard drinks or equivalent per week   Drug use: Never     Medication list has been reviewed and updated.  Current Meds  Medication Sig   amLODipine (NORVASC) 5 MG tablet Take 1 tablet (5 mg total) by mouth daily. kowalski   azithromycin (ZITHROMAX) 250 MG tablet Take 2 tablets on day 1, then 1 tablet daily on days 2 through 5   diphenhydrAMINE (BENADRYL) 25 mg capsule Take 25 mg by mouth at bedtime as needed.   ezetimibe (ZETIA) 10 MG tablet Take 1 tablet (10 mg total) by mouth daily.   metoprolol succinate (TOPROL-XL) 25 MG 24 hr tablet TAKE 1 TABLET(25 MG) BY MOUTH DAILY   mupirocin ointment (  BACTROBAN) 2 % Apply 1 Application topically 2 (two) times daily.       09/23/2023   10:44 AM 12/31/2022    9:23 AM 06/18/2022   10:40 AM 12/02/2021    3:22 PM  GAD 7 : Generalized Anxiety Score  Nervous, Anxious, on Edge 0 0 1 1  Control/stop worrying 0 0 0 0  Worry too much - different things 0 0 0 1  Trouble relaxing 0 0 0 0  Restless 0 0 0 0  Easily annoyed or irritable 0 0 3 1  Afraid - awful might happen 0 0 0 0  Total GAD 7 Score 0 0 4 3  Anxiety Difficulty Not difficult at all Not difficult at all Not difficult at all Not difficult at all       09/23/2023   10:44 AM 12/31/2022    9:23 AM 06/18/2022   10:36 AM  Depression screen PHQ 2/9  Decreased Interest 0 0 0   Down, Depressed, Hopeless 0 0 1  PHQ - 2 Score 0 0 1  Altered sleeping 0 0 0  Tired, decreased energy 0 0 1  Change in appetite 0 0   Feeling bad or failure about yourself  0 0 1  Trouble concentrating 0 0 0  Moving slowly or fidgety/restless 0 0 0  Suicidal thoughts 0 0 0  PHQ-9 Score 0 0 3  Difficult doing work/chores Not difficult at all Not difficult at all Not difficult at all    BP Readings from Last 3 Encounters:  09/23/23 124/78  12/31/22 120/76  10/23/22 110/75    Physical Exam Vitals and nursing note reviewed. Exam conducted with a chaperone present.  Constitutional:      General: She is not in acute distress.    Appearance: She is not diaphoretic.  HENT:     Head: Normocephalic and atraumatic.     Right Ear: External ear normal.     Left Ear: External ear normal.     Nose: Nose normal.  Eyes:     General:        Right eye: No discharge.        Left eye: No discharge.     Conjunctiva/sclera: Conjunctivae normal.     Pupils: Pupils are equal, round, and reactive to light.  Neck:     Thyroid: No thyromegaly.     Vascular: No JVD.  Cardiovascular:     Rate and Rhythm: Normal rate and regular rhythm.     Heart sounds: Normal heart sounds. No murmur heard.    No friction rub. No gallop.  Pulmonary:     Effort: Pulmonary effort is normal.     Breath sounds: Normal breath sounds. No wheezing, rhonchi or rales.  Abdominal:     General: Bowel sounds are normal.     Palpations: Abdomen is soft. There is no mass.     Tenderness: There is no abdominal tenderness. There is no guarding.  Musculoskeletal:        General: Normal range of motion.     Cervical back: Normal range of motion and neck supple.  Lymphadenopathy:     Cervical: No cervical adenopathy.  Skin:    General: Skin is warm and dry.     Findings: Erythema present.  Neurological:     Mental Status: She is alert.     Deep Tendon Reflexes: Reflexes are normal and symmetric.     Wt Readings from  Last 3 Encounters:  09/23/23 163 lb  12.8 oz (74.3 kg)  12/31/22 166 lb (75.3 kg)  10/23/22 162 lb (73.5 kg)    BP 124/78   Pulse 74   Ht 5\' 7"  (1.702 m)   Wt 163 lb 12.8 oz (74.3 kg)   SpO2 98%   BMI 25.65 kg/m   Assessment and Plan: 1. Infection of cat scratch wound (Primary) New onset.  Persistent.  Stable.  Without fever or chills.  Onset with exposure to a feral cat and scratched the superficial surface of the chest even a rash that developed a couple days later which was erythematous itchy and burning.  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  We will treat with azithromycin 2 tablets today followed by 1 a day for 4 days refill and 5 days after if it is not completely subsided.  Patient is also been informed to clean the surface with Hibiclens or pHisoDerm and to apply Bactroban to the area in case there is a staph infection involved as well. - azithromycin (ZITHROMAX) 250 MG tablet; Take 2 tablets on day 1, then 1 tablet daily on days 2 through 5  Dispense: 6 tablet; Refill: 1 - mupirocin ointment (BACTROBAN) 2 %; Apply 1 Application topically 2 (two) times daily.  Dispense: 22 g; Refill: 0     Elizabeth Sauer, MD

## 2023-11-16 ENCOUNTER — Encounter: Payer: Self-pay | Admitting: Family Medicine

## 2023-11-16 ENCOUNTER — Ambulatory Visit: Payer: 59 | Admitting: Family Medicine

## 2023-11-16 VITALS — BP 142/78 | HR 69 | Ht 67.0 in | Wt 166.0 lb

## 2023-11-16 DIAGNOSIS — J0100 Acute maxillary sinusitis, unspecified: Secondary | ICD-10-CM

## 2023-11-16 MED ORDER — AZITHROMYCIN 250 MG PO TABS: ORAL_TABLET | ORAL | 0 refills | Status: AC

## 2023-11-16 NOTE — Progress Notes (Unsigned)
 Date:  11/16/2023   Name:  Rebekah Vasquez   DOB:  Feb 16, 1971   MRN:  [id number]   Chief Complaint: Cough (X 3 days. Feels like has congestion in upper chest. )  Cough This is a new problem. The current episode started in the past 7 days. The problem has been waxing and waning. The problem occurs constantly. The cough is Non-productive. Associated symptoms include nasal congestion, postnasal drip and sweats. Pertinent negatives include no chest pain, chills, ear congestion, ear pain, fever, headaches, heartburn, hemoptysis, myalgias, rash, rhinorrhea, sore throat, shortness of breath, weight loss or wheezing. Nothing aggravates the symptoms. She has tried nothing for the symptoms. The treatment provided moderate relief.  Sinusitis This is a chronic problem. The current episode started in the past 7 days. The problem has been gradually improving since onset. There has been no fever. The pain is mild. Associated symptoms include coughing. Pertinent negatives include no chills, ear pain, headaches, shortness of breath, sinus pressure or sore throat. The treatment provided mild relief.    Lab Results  Component Value Date   NA 141 12/31/2022   K 3.7 12/31/2022   CO2 22 12/31/2022   GLUCOSE 91 12/31/2022   BUN 9 12/31/2022   CREATININE 0.93 12/31/2022   CALCIUM 8.8 12/31/2022   EGFR 74 12/31/2022   Lab Results  Component Value Date   CHOL 221 (H) 06/18/2022   HDL 52 06/18/2022   LDLCALC 134 (H) 06/18/2022   TRIG 197 (H) 06/18/2022   No results found for: "TSH" No results found for: "HGBA1C" No results found for: "WBC", "HGB", "HCT", "MCV", "PLT" Lab Results  Component Value Date   ALT 13 06/18/2022   AST 19 06/18/2022   ALKPHOS 68 06/18/2022   BILITOT 0.3 06/18/2022   No results found for: "25OHVITD2", "25OHVITD3", "VD25OH"   Review of Systems  Constitutional:  Negative for chills, fever and weight loss.  HENT:  Positive for postnasal drip. Negative for ear pain, nosebleeds,  rhinorrhea, sinus pressure, sinus pain and sore throat.   Respiratory:  Positive for cough and stridor. Negative for hemoptysis, chest tightness, shortness of breath and wheezing.   Cardiovascular:  Negative for chest pain, palpitations and leg swelling.  Gastrointestinal:  Negative for abdominal distention, abdominal pain, anal bleeding, blood in stool and heartburn.  Musculoskeletal:  Negative for myalgias.  Skin:  Negative for rash.  Neurological:  Negative for headaches.    Patient Active Problem List   Diagnosis Date Noted   Screening for colon cancer 10/23/2022   Polyp of sigmoid colon 10/23/2022   Cervical paraspinal muscle spasm 10/27/2021   Strain of left supraspinatus muscle 10/27/2021   Benign essential HTN 11/11/2018   Stable angina pectoris (HCC) 11/11/2018   Premenstrual tension syndrome 11/10/2018   Migraine 09/28/2005    Allergies  Allergen Reactions   Vitamin E Rash   Hydrochlorothiazide Other (See Comments)    Dizziness   Propranolol Other (See Comments)    Dizziness   Penicillins Rash    Past Surgical History:  Procedure Laterality Date   ABDOMINAL SURGERY     COLONOSCOPY WITH PROPOFOL N/A 10/23/2022   Procedure: COLONOSCOPY WITH PROPOFOL;  Surgeon: Midge Minium, MD;  Location: [facility];  Service: Endoscopy;  Laterality: N/A;    Social History   Tobacco Use   Smoking status: Never   Smokeless tobacco: Never  Vaping Use   Vaping status: Never Used  Substance Use Topics   Alcohol use: Yes  Alcohol/week: 6.0 standard drinks of alcohol    Types: 6 Standard drinks or equivalent per week   Drug use: Never     Medication list has been reviewed and updated.  Current Meds  Medication Sig   amLODipine (NORVASC) 5 MG tablet Take 1 tablet (5 mg total) by mouth daily. kowalski   diphenhydrAMINE (BENADRYL) 25 mg capsule Take 25 mg by mouth at bedtime as needed.   ezetimibe (ZETIA) 10 MG tablet Take 1 tablet (10 mg total) by mouth daily.    metoprolol succinate (TOPROL-XL) 25 MG 24 hr tablet TAKE 1 TABLET(25 MG) BY MOUTH DAILY   mupirocin ointment (BACTROBAN) 2 % Apply 1 Application topically 2 (two) times daily.       11/16/2023    4:12 PM 09/23/2023   10:44 AM 12/31/2022    9:23 AM 06/18/2022   10:40 AM  GAD 7 : Generalized Anxiety Score  Nervous, Anxious, on Edge 0 0 0 1  Control/stop worrying 1 0 0 0  Worry too much - different things 0 0 0 0  Trouble relaxing 0 0 0 0  Restless 0 0 0 0  Easily annoyed or irritable 1 0 0 3  Afraid - awful might happen 0 0 0 0  Total GAD 7 Score 2 0 0 4  Anxiety Difficulty Not difficult at all Not difficult at all Not difficult at all Not difficult at all       11/16/2023    4:12 PM 09/23/2023   10:44 AM 12/31/2022    9:23 AM  Depression screen PHQ 2/9  Decreased Interest 1 0 0  Down, Depressed, Hopeless 0 0 0  PHQ - 2 Score 1 0 0  Altered sleeping 0 0 0  Tired, decreased energy 1 0 0  Change in appetite 1 0 0  Feeling bad or failure about yourself  0 0 0  Trouble concentrating 1 0 0  Moving slowly or fidgety/restless 0 0 0  Suicidal thoughts 0 0 0  PHQ-9 Score 4 0 0  Difficult doing work/chores Not difficult at all Not difficult at all Not difficult at all    BP Readings from Last 3 Encounters:  11/16/23 (!) 142/78  09/23/23 124/78  12/31/22 120/76    Physical Exam Vitals and nursing note reviewed.  Constitutional:      General: She is not in acute distress.    Appearance: She is not diaphoretic.  HENT:     Head: Normocephalic and atraumatic.     Salivary Glands: Right salivary gland is not tender. Left salivary gland is not tender.     Right Ear: External ear normal.     Left Ear: External ear normal.     Nose: Nose normal.     Right Sinus: No maxillary sinus tenderness or frontal sinus tenderness.     Left Sinus: No maxillary sinus tenderness or frontal sinus tenderness.  Eyes:     General:        Right eye: No discharge.        Left eye: No discharge.      Conjunctiva/sclera: Conjunctivae normal.     Pupils: Pupils are equal, round, and reactive to light.  Neck:     Thyroid: No thyromegaly.     Vascular: No JVD.  Cardiovascular:     Rate and Rhythm: Normal rate and regular rhythm.     Heart sounds: Normal heart sounds. No murmur heard.    No friction rub. No gallop.  Pulmonary:  Effort: Pulmonary effort is normal.     Breath sounds: Normal breath sounds. No wheezing or rales.  Chest:     Chest wall: No tenderness.  Abdominal:     General: Bowel sounds are normal.     Palpations: Abdomen is soft. There is no mass.     Tenderness: There is no abdominal tenderness. There is no guarding.  Musculoskeletal:        General: Normal range of motion.     Cervical back: Normal range of motion and neck supple.  Lymphadenopathy:     Cervical: No cervical adenopathy.  Skin:    General: Skin is warm and dry.  Neurological:     Mental Status: She is alert.     Deep Tendon Reflexes: Reflexes are normal and symmetric.     Wt Readings from Last 3 Encounters:  11/16/23 166 lb (75.3 kg)  09/23/23 163 lb 12.8 oz (74.3 kg)  12/31/22 166 lb (75.3 kg)    BP (!) 142/78   Pulse 69   Ht 5\' 7"  (1.702 m)   Wt 166 lb (75.3 kg)   SpO2 97%   BMI 26.00 kg/m   Assessment and Plan:  1. Acute non-recurrent maxillary sinusitis (Primary) Acute.  Persistent.  Stable.  Patient has history and exam consistent with sinusitis.  Given the circumstances of upcoming inclement weather we will go ahead and initiate treatment.  We will prescribe azithromycin 250 mg 2 today followed by 1 a day for 4 days.  Will recheck on an as-needed basis.   Elizabeth Sauer, MD

## 2023-12-12 ENCOUNTER — Other Ambulatory Visit: Payer: Self-pay | Admitting: Family Medicine

## 2023-12-12 DIAGNOSIS — E785 Hyperlipidemia, unspecified: Secondary | ICD-10-CM

## 2024-03-02 ENCOUNTER — Ambulatory Visit
Admission: RE | Admit: 2024-03-02 | Discharge: 2024-03-02 | Disposition: A | Discharge status: Home / Self Care | Source: Ambulatory Visit | Attending: Family Medicine | Admitting: Family Medicine

## 2024-03-02 VITALS — BP 152/87 | HR 65 | Temp 99.2°F | Resp 18

## 2024-03-02 DIAGNOSIS — J069 Acute upper respiratory infection, unspecified: Secondary | ICD-10-CM

## 2024-03-02 MED ORDER — LEVALBUTEROL TARTRATE 45 MCG/ACT IN AERO: 2.0000 | INHALATION_SPRAY | Freq: Four times a day (QID) | RESPIRATORY_TRACT | 12 refills | Status: AC | PRN

## 2024-03-02 MED ORDER — PREDNISONE 10 MG (21) PO TBPK: ORAL_TABLET | Freq: Every day | ORAL | 0 refills | Status: DC

## 2024-03-02 NOTE — Discharge Instructions (Addendum)
 You have a viral respiratory infection that will gradually improve over the next 7-10 days. Cough may last up to 3 weeks.     You can take Tylenol and/or Ibuprofen as needed for fever reduction and pain relief.    For cough: honey 1/2 to 1 teaspoon (you can dilute the honey in water  or another fluid). You can also use guaifenesin  and dextromethorphan for cough. You can use a humidifier for chest congestion and cough.  If you don't have a humidifier, you can sit in the bathroom with the hot shower running.      For sore throat: try warm salt water  gargles, Mucinex  sore throat cough drops or cepacol lozenges, throat spray, warm tea or water  with lemon/honey, popsicles or ice, or OTC cold relief medicine for throat discomfort. You can also purchase chloraseptic spray at the pharmacy or dollar store.   For congestion: take a daily anti-histamine like Zyrtec, Claritin, and a oral decongestant, such as pseudoephedrine.  You can also use Xlear orFlonase.    It is important to stay hydrated: drink plenty of fluids (water , gatorade/powerade/pedialyte, juices, or teas) to keep your throat moisturized and help further relieve irritation/discomfort.    Return or go to the Emergency Department if symptoms worsen or do not improve in the next few days

## 2024-03-02 NOTE — ED Provider Notes (Signed)
 [facility]    CSN: [id number] Arrival date & time: 03/02/24  1657      History   Chief Complaint Chief Complaint  Patient presents with   Cough    Sinus infection symptoms, head pressure, drainage, sore throat, cough, overall malaise - Entered by patient   sinus pressure    Headache    HPI Clarivel Z Carte is a 53 y.o. female.   HPI  History obtained from the patient. Kyran presents for 3 days of sinus pressure, headache, sinus congestion, generalized weakness, post-nasal drip and deep cough. Very little sputum with her cough. No fever, vomiting, diarrhea, throat pain/  Took NyQuil and Alka Selzter cold medication and Advil gels. Thinks this is the cause of her elevated blood pressure.  Her grand-daughter and daughter are sick with similar sx and she has been around them.    No history of asthma. Denies vaping and smoking.        Past Medical History:  Diagnosis Date   Hypertension    Motion sickness    Boats, planes    Patient Active Problem List   Diagnosis Date Noted   Screening for colon cancer 10/23/2022   Polyp of sigmoid colon 10/23/2022   Cervical paraspinal muscle spasm 10/27/2021   Strain of left supraspinatus muscle 10/27/2021   Benign essential HTN 11/11/2018   Stable angina pectoris (HCC) 11/11/2018   Premenstrual tension syndrome 11/10/2018   Migraine 09/28/2005    Past Surgical History:  Procedure Laterality Date   ABDOMINAL SURGERY     COLONOSCOPY WITH PROPOFOL  N/A 10/23/2022   Procedure: COLONOSCOPY WITH PROPOFOL ;  Surgeon: Jinny Carmine, MD;  Location: [facility];  Service: Endoscopy;  Laterality: N/A;    OB History   No obstetric history on file.      Home Medications    Prior to Admission medications   Medication Sig Start Date End Date Taking? Authorizing Provider  levalbuterol  (XOPENEX  HFA) 45 MCG/ACT inhaler Inhale 2 puffs into the lungs every 6 (six) hours as needed for wheezing. 03/02/24  Yes Ayame Rena, DO   predniSONE  (STERAPRED UNI-PAK 21 TAB) 10 MG (21) TBPK tablet Take by mouth daily. Take 6 tabs by mouth daily for 1, then 5 tabs for 1 day, then 4 tabs for 1 day, then 3 tabs for 1 day, then 2 tabs for 1 day, then 1 tab for 1 day. 03/02/24  Yes Raequon Catanzaro, DO  amLODipine  (NORVASC ) 5 MG tablet Take 1 tablet (5 mg total) by mouth daily. kowalski 12/31/22 12/31/23  Joshua Cathryne BROCKS, MD  diphenhydrAMINE (BENADRYL) 25 mg capsule Take 25 mg by mouth at bedtime as needed.    [provider]  ezetimibe  (ZETIA ) 10 MG tablet TAKE 1 TABLET BY MOUTH EVERY DAY 12/12/23   Jones, Deanna C, MD  metoprolol  succinate (TOPROL -XL) 25 MG 24 hr tablet TAKE 1 TABLET(25 MG) BY MOUTH DAILY 12/31/22   Jones, Deanna C, MD  mupirocin  ointment (BACTROBAN ) 2 % Apply 1 Application topically 2 (two) times daily. 09/23/23   Joshua Cathryne BROCKS, MD    Family History History reviewed. No pertinent family history.  Social History Social History   Tobacco Use   Smoking status: Never   Smokeless tobacco: Never  Vaping Use   Vaping status: Never Used  Substance Use Topics   Alcohol use: Yes    Alcohol/week: 6.0 standard drinks of alcohol    Types: 6 Standard drinks or equivalent per week   Drug use: Never  Allergies   Poison ivy extract, Vitamin e, Hydrochlorothiazide, Propranolol, and Penicillins   Review of Systems Review of Systems: negative unless otherwise stated in HPI.      Physical Exam Triage Vital Signs ED Triage Vitals  Encounter Vitals Group     BP      Systolic BP Percentile      Diastolic BP Percentile      Pulse      Resp      Temp      Temp src      SpO2      Weight      Height      Head Circumference      Peak Flow      Pain Score      Pain Loc      Pain Education      Exclude from Growth Chart    No data found.  Updated Vital Signs BP (!) 152/87 (BP Location: Right Arm)   Pulse 65   Temp 99.2 F (37.3 C) (Oral)   Resp 18   SpO2 97%   Visual Acuity Right Eye  Distance:   Left Eye Distance:   Bilateral Distance:    Right Eye Near:   Left Eye Near:    Bilateral Near:     Physical Exam GEN:     alert, non-toxic appearing female in no distress    HENT:  mucus membranes moist, oropharyngeal without lesions or erythema, no tonsillar hypertrophy or exudates,  moderate erythematous edematous turbinates, clear nasal discharge, maxillary sinus tenderness but no frontal sinus tenderness  EYES:   pupils equal and reactive, no scleral injection or discharge NECK:  normal ROM,no meningismus   RESP:  no increased work of breathing, clear to auscultation bilaterally CVS:   regular rate and rhythm Skin:   warm and dry, no rash on visible skin    UC Treatments / Results  Labs (all labs ordered are listed, but only abnormal results are displayed) Labs Reviewed - No data to display  EKG   Radiology   Procedures Procedures (including critical care time)  Medications Ordered in UC Medications - No data to display  Initial Impression / Assessment and Plan / UC Course  I have reviewed the triage vital signs and the nursing notes.  Pertinent labs & imaging results that were available during my care of the patient were reviewed by me and considered in my medical decision making (see chart for details).       Pt is a 53 y.o. female who presents for 3 days of respiratory symptoms. Taria is afebrile here without recent antipyretics. Satting well on room air. Overall pt is non-toxic appearing, well hydrated, without respiratory distress. Pulmonary exam is unremarkable.  COVID and influenza panel declined. History consistent with viral respiratory illness. Discussed symptomatic treatment.  Explained lack of efficacy of antibiotics in viral disease.  Levalbuterol  and prednisone  prescribed.  Typical duration of symptoms discussed.   Return and ED precautions given and voiced understanding. Discussed MDM, treatment plan and plan for follow-up with patient who  agrees with plan.     Final Clinical Impressions(s) / UC Diagnoses   Final diagnoses:  Viral URI with cough     Discharge Instructions      You have a viral respiratory infection that will gradually improve over the next 7-10 days. Cough may last up to 3 weeks.     You can take Tylenol and/or Ibuprofen as needed for fever reduction and  pain relief.    For cough: honey 1/2 to 1 teaspoon (you can dilute the honey in water  or another fluid). You can also use guaifenesin  and dextromethorphan for cough. You can use a humidifier for chest congestion and cough.  If you don't have a humidifier, you can sit in the bathroom with the hot shower running.      For sore throat: try warm salt water  gargles, Mucinex  sore throat cough drops or cepacol lozenges, throat spray, warm tea or water  with lemon/honey, popsicles or ice, or OTC cold relief medicine for throat discomfort. You can also purchase chloraseptic spray at the pharmacy or dollar store.   For congestion: take a daily anti-histamine like Zyrtec, Claritin, and a oral decongestant, such as pseudoephedrine.  You can also use Xlear orFlonase.    It is important to stay hydrated: drink plenty of fluids (water , gatorade/powerade/pedialyte, juices, or teas) to keep your throat moisturized and help further relieve irritation/discomfort.    Return or go to the Emergency Department if symptoms worsen or do not improve in the next few days      ED Prescriptions     Medication Sig Dispense Auth. Provider   predniSONE  (STERAPRED UNI-PAK 21 TAB) 10 MG (21) TBPK tablet Take by mouth daily. Take 6 tabs by mouth daily for 1, then 5 tabs for 1 day, then 4 tabs for 1 day, then 3 tabs for 1 day, then 2 tabs for 1 day, then 1 tab for 1 day. 21 tablet Nyjah Schwake, DO   levalbuterol  (XOPENEX  HFA) 45 MCG/ACT inhaler Inhale 2 puffs into the lungs every 6 (six) hours as needed for wheezing. 1 each Avamae Dehaan, DO      PDMP not reviewed this  encounter.   Kriste Berth, DO 03/18/24 2051

## 2024-03-02 NOTE — ED Triage Notes (Signed)
 Pt presents with sinus pressure, cough, headache, and runny nose x 4 days.

## 2024-03-17 ENCOUNTER — Ambulatory Visit
Admission: RE | Admit: 2024-03-17 | Discharge: 2024-03-17 | Disposition: A | Discharge status: Home / Self Care | Payer: Self-pay | Source: Ambulatory Visit | Attending: Physician Assistant

## 2024-03-17 ENCOUNTER — Ambulatory Visit (INDEPENDENT_AMBULATORY_CARE_PROVIDER_SITE_OTHER)

## 2024-03-17 ENCOUNTER — Ambulatory Visit: Payer: Self-pay | Admitting: Physician Assistant

## 2024-03-17 VITALS — BP 123/97 | HR 78 | Temp 98.2°F | Ht 67.0 in | Wt 165.0 lb

## 2024-03-17 DIAGNOSIS — J029 Acute pharyngitis, unspecified: Secondary | ICD-10-CM | POA: Diagnosis not present

## 2024-03-17 DIAGNOSIS — R051 Acute cough: Secondary | ICD-10-CM

## 2024-03-17 DIAGNOSIS — J209 Acute bronchitis, unspecified: Secondary | ICD-10-CM | POA: Diagnosis not present

## 2024-03-17 LAB — GROUP A STREP BY PCR: Group A Strep by PCR: NOT DETECTED

## 2024-03-17 NOTE — Discharge Instructions (Addendum)
-   I did not see a pneumonia on your x-ray but you will be contacted if the radiologist does not else in antibiotics. - Were checking you for strep.  If that is positive we will also call you and send antibiotics. - If you not hear from us , all testing is negative and there is no indication for antibiotics based on your exam.  It is consistent with viral bronchitis which can last up to 6 weeks.  Continue with Coricidin HBP, Flonase, and use the inhaler as needed. - If you run a fever or start to feel worse please return for another evaluation.

## 2024-03-17 NOTE — ED Triage Notes (Signed)
 Pt c/o continued cough and congestion from visit on 03/02/24  Pt took a home covid and flu test and it was negative  Pt was given prednisone  and an inhaler on her last visit and states that she completed the medication but the symptoms have worsened.  Pt states that she is now wheezing and has fatigue with productive cough.   Pt states that she has had lymph node swelling in her neck and a feeling of drainage.

## 2024-03-17 NOTE — ED Provider Notes (Signed)
 [facility]    CSN: [id number] Arrival date & time: 03/17/24  1215      History   Chief Complaint Chief Complaint  Patient presents with   Cough    HPI Rebekah Vasquez is a 53 y.o. female presenting for cough, congestion and sore throat for the past 3 weeks.  Patient seen here on 6/5 and given an inhaler and prednisone .  Reports not improving at all.  Started to have a sore throat and increased pain with swallowing recently.  She denies fever, ear pain, sinus pain, chest pain.  Occasional shortness of breath but the inhaler helps.  Recently refilled the inhaler.  Has also been taking Coricidin HBP.  Patient reports having an upcoming funeral and wants to be sure she does not have any illness that she can transmit to others.  She is also concerned she might need antibiotic because she has been sick for 3 weeks.  No history of asthma, COPD and patient is a non-smoker.  HPI  Past Medical History:  Diagnosis Date   Hypertension    Motion sickness    Boats, planes    Patient Active Problem List   Diagnosis Date Noted   Screening for colon cancer 10/23/2022   Polyp of sigmoid colon 10/23/2022   Cervical paraspinal muscle spasm 10/27/2021   Strain of left supraspinatus muscle 10/27/2021   Benign essential HTN 11/11/2018   Stable angina pectoris (HCC) 11/11/2018   Premenstrual tension syndrome 11/10/2018   Migraine 09/28/2005    Past Surgical History:  Procedure Laterality Date   ABDOMINAL SURGERY     COLONOSCOPY WITH PROPOFOL  N/A 10/23/2022   Procedure: COLONOSCOPY WITH PROPOFOL ;  Surgeon: Marnee Sink, MD;  Location: [facility];  Service: Endoscopy;  Laterality: N/A;    OB History   No obstetric history on file.      Home Medications    Prior to Admission medications   Medication Sig Start Date End Date Taking? Authorizing Provider  diphenhydrAMINE (BENADRYL) 25 mg capsule Take 25 mg by mouth at bedtime as needed.   Yes [provider]   ezetimibe  (ZETIA ) 10 MG tablet TAKE 1 TABLET BY MOUTH EVERY DAY 12/12/23  Yes Clarise Crooks, MD  levalbuterol  (XOPENEX  HFA) 45 MCG/ACT inhaler Inhale 2 puffs into the lungs every 6 (six) hours as needed for wheezing. 03/02/24  Yes Brimage, Vondra, DO  metoprolol  succinate (TOPROL -XL) 25 MG 24 hr tablet TAKE 1 TABLET(25 MG) BY MOUTH DAILY 12/31/22  Yes Jones, Deanna C, MD  mupirocin  ointment (BACTROBAN ) 2 % Apply 1 Application topically 2 (two) times daily. 09/23/23  Yes Clarise Crooks, MD  predniSONE  (STERAPRED UNI-PAK 21 TAB) 10 MG (21) TBPK tablet Take by mouth daily. Take 6 tabs by mouth daily for 1, then 5 tabs for 1 day, then 4 tabs for 1 day, then 3 tabs for 1 day, then 2 tabs for 1 day, then 1 tab for 1 day. 03/02/24  Yes Brimage, Vondra, DO  amLODipine  (NORVASC ) 5 MG tablet Take 1 tablet (5 mg total) by mouth daily. kowalski 12/31/22 12/31/23  Clarise Crooks, MD    Family History History reviewed. No pertinent family history.  Social History Social History   Tobacco Use   Smoking status: Never   Smokeless tobacco: Never  Vaping Use   Vaping status: Never Used  Substance Use Topics   Alcohol use: Yes    Alcohol/week: 6.0 standard drinks of alcohol    Types: 6 Standard drinks or  equivalent per week   Drug use: Never     Allergies   Poison ivy extract, Vitamin e, Hydrochlorothiazide, Propranolol, and Penicillins   Review of Systems Review of Systems  Constitutional:  Positive for fatigue. Negative for chills, diaphoresis and fever.  HENT:  Positive for congestion. Negative for ear pain, rhinorrhea, sinus pressure, sinus pain and sore throat.   Respiratory:  Positive for cough and shortness of breath.   Cardiovascular:  Negative for chest pain.  Gastrointestinal:  Negative for abdominal pain, nausea and vomiting.  Musculoskeletal:  Negative for myalgias.  Skin:  Negative for rash.  Neurological:  Negative for weakness and headaches.  Hematological:  Positive for adenopathy.      Physical Exam Triage Vital Signs ED Triage Vitals  Encounter Vitals Group     BP 03/17/24 1220 (!) 123/97     Girls Systolic BP Percentile --      Girls Diastolic BP Percentile --      Boys Systolic BP Percentile --      Boys Diastolic BP Percentile --      Pulse Rate 03/17/24 1220 78     Resp --      Temp 03/17/24 1220 98.2 F (36.8 C)     Temp Source 03/17/24 1220 Oral     SpO2 03/17/24 1220 100 %     Weight 03/17/24 1224 165 lb (74.8 kg)     Height 03/17/24 1224 5' 7 (1.702 m)     Head Circumference --      Peak Flow --      Pain Score 03/17/24 1220 6     Pain Loc --      Pain Education --      Exclude from Growth Chart --    No data found.  Updated Vital Signs BP (!) 123/97 (BP Location: Left Arm)   Pulse 78   Temp 98.2 F (36.8 C) (Oral)   Ht 5' 7 (1.702 m)   Wt 165 lb (74.8 kg)   SpO2 100%   BMI 25.84 kg/m    Physical Exam Vitals and nursing note reviewed.  Constitutional:      General: She is not in acute distress.    Appearance: Normal appearance. She is not ill-appearing or toxic-appearing.  HENT:     Head: Normocephalic and atraumatic.     Right Ear: Tympanic membrane, ear canal and external ear normal.     Left Ear: Tympanic membrane, ear canal and external ear normal.     Nose: Nose normal.     Mouth/Throat:     Mouth: Mucous membranes are moist.     Pharynx: Oropharynx is clear. Posterior oropharyngeal erythema (mild with clear PND) present.   Eyes:     General: No scleral icterus.       Right eye: No discharge.        Left eye: No discharge.     Conjunctiva/sclera: Conjunctivae normal.    Cardiovascular:     Rate and Rhythm: Normal rate and regular rhythm.     Heart sounds: Normal heart sounds.  Pulmonary:     Effort: Pulmonary effort is normal. No respiratory distress.     Breath sounds: Normal breath sounds.   Musculoskeletal:     Cervical back: Neck supple.   Skin:    General: Skin is dry.   Neurological:     General:  No focal deficit present.     Mental Status: She is alert. Mental status is at baseline.  Motor: No weakness.     Gait: Gait normal.   Psychiatric:        Mood and Affect: Mood normal.        Behavior: Behavior normal.      UC Treatments / Results  Labs (all labs ordered are listed, but only abnormal results are displayed) Labs Reviewed  GROUP A STREP BY PCR    EKG   Radiology DG Chest 2 View Result Date: 03/17/2024 CLINICAL DATA:  Cough for 3 weeks. Negative COVID and flu test at home. Was given prednisone  inhaler but the symptoms have worsened. Wheezing, fatigue, productive cough. EXAM: CHEST - 2 VIEW COMPARISON:  None Available. FINDINGS: Cardiac silhouette and mediastinal contours are within normal limits. The lungs are clear. No pleural effusion or pneumothorax. No acute skeletal abnormality. IMPRESSION: No active cardiopulmonary disease. Electronically Signed   By: Bertina Broccoli M.D.   On: 03/17/2024 14:26    Procedures Procedures (including critical care time)  Medications Ordered in UC Medications - No data to display  Initial Impression / Assessment and Plan / UC Course  I have reviewed the triage vital signs and the nursing notes.  Pertinent labs & imaging results that were available during my care of the patient were reviewed by me and considered in my medical decision making (see chart for details).   53 year old female presents for 3-week history of cough and congestion.  Recently started to have a worsening sore throat and swollen lymph nodes of neck.  Reports occasional shortness of breath.  Seen here on 03/02/2024.  Given an inhaler, prednisone  and has been taking over-the-counter cough medicine.  Reports no improvement in symptoms.  Not a smoker and no history of asthma or COPD.  Reviewed previous note from 03/02/24.  Vitals are stable.  She is overall well-appearing.  No acute distress.  Oxygen 100%.  Afebrile.  On exam has no significant nasal congestion.   Mild posterior pharyngeal erythema with clear postnasal drainage.  Chest clear and 4 auscultation fields.  PCR strep test is negative.  Chest x-ray performed given extent of cough.  Chest x-ray wet read was negative.  Advised her if the radiologist reads something as acutely abnormal she will be contacted.  Any evidence of pneumonia was treated with antibiotics.  Viral bronchitis.  Supportive care encouraged.  Discussed typical duration of viral bronchitis.  Encouraged increasing rest and fluids.  Advised to use inhaler as needed for any shortness of breath but she honestly should not need it as her oxygen is at 100% her lungs are clear.  Advised her to return if not feeling better in a couple weeks or if she develops a fever or worsening symptoms.  Patient is agreeable to plan.   Final Clinical Impressions(s) / UC Diagnoses   Final diagnoses:  Acute cough  Acute bronchitis, unspecified organism  Sore throat     Discharge Instructions      - I did not see a pneumonia on your x-ray but you will be contacted if the radiologist does not else in antibiotics. - Were checking you for strep.  If that is positive we will also call you and send antibiotics. - If you not hear from us , all testing is negative and there is no indication for antibiotics based on your exam.  It is consistent with viral bronchitis which can last up to 6 weeks.  Continue with Coricidin HBP, Flonase, and use the inhaler as needed. - If you run a fever or start to  feel worse please return for another evaluation.    ED Prescriptions   None    PDMP not reviewed this encounter.   Floydene Hy, PA-C 03/17/24 [phone]

## 2024-04-12 ENCOUNTER — Encounter: Admitting: Student

## 2024-06-08 ENCOUNTER — Encounter: Admitting: Student

## 2024-06-30 NOTE — Progress Notes (Unsigned)
 Sleep Medicine   Office Visit  Patient Name: Rebekah Vasquez DOB: 19-Feb-1971 MRN [id number]    Chief Complaint:   Brief History:  Rebekah Vasquez presents for an initial consult for sleep evaluation and to establish care. Patient has some months history of loud snoring. Sleep quality is poor. This is noted every night. The patient's bed partner reports loud snoring at night. The patient relates the following symptoms: fatigue, occasional migraines, trouble concentrating and brain fog are also present. The patient goes to sleep at 1000 pm and wakes up at 0730 am and will wake up multiple times in between with some trouble returning to sleep.  Sleep quality is the same when outside home environment.  Patient has noted some movement of her legs at night that would disrupt her sleep.  The patient  relates no unusual behavior during the night.  The patient relates anxiety as a history of psychiatric problems. The Epworth Sleepiness Score is 5 out of 24 .  The patient relates  Cardiovascular risk factors include: HTN.    ROS  General: (-) fever, (-) chills, (-) night sweat Nose and Sinuses: (-) nasal stuffiness or itchiness, (-) postnasal drip, (-) nosebleeds, (-) sinus trouble. Mouth and Throat: (-) sore throat, (-) hoarseness. Neck: (-) swollen glands, (-) enlarged thyroid, (-) neck pain. Respiratory: - cough, - shortness of breath, - wheezing. Neurologic: - numbness, - tingling. Psychiatric: - anxiety, - depression Sleep behavior: -sleep paralysis -hypnogogic hallucinations -dream enactment      -vivid dreams -cataplexy -night terrors -sleep walking   Current Medication: Outpatient Encounter Medications as of 07/03/2024  Medication Sig   amLODipine  (NORVASC ) 5 MG tablet Take 1 tablet (5 mg total) by mouth daily. kowalski   diphenhydrAMINE (BENADRYL) 25 mg capsule Take 25 mg by mouth at bedtime as needed.   ezetimibe  (ZETIA ) 10 MG tablet TAKE 1 TABLET BY MOUTH EVERY DAY   levalbuterol  (XOPENEX  HFA) 45  MCG/ACT inhaler Inhale 2 puffs into the lungs every 6 (six) hours as needed for wheezing.   metoprolol  succinate (TOPROL -XL) 25 MG 24 hr tablet TAKE 1 TABLET(25 MG) BY MOUTH DAILY   mupirocin  ointment (BACTROBAN ) 2 % Apply 1 Application topically 2 (two) times daily.   predniSONE  (STERAPRED UNI-PAK 21 TAB) 10 MG (21) TBPK tablet Take by mouth daily. Take 6 tabs by mouth daily for 1, then 5 tabs for 1 day, then 4 tabs for 1 day, then 3 tabs for 1 day, then 2 tabs for 1 day, then 1 tab for 1 day.   No facility-administered encounter medications on file as of 07/03/2024.    Surgical History: Past Surgical History:  Procedure Laterality Date   ABDOMINAL SURGERY     COLONOSCOPY WITH PROPOFOL  N/A 10/23/2022   Procedure: COLONOSCOPY WITH PROPOFOL ;  Surgeon: Jinny Carmine, MD;  Location: [facility];  Service: Endoscopy;  Laterality: N/A;    Medical History: Past Medical History:  Diagnosis Date   Hypertension    Motion sickness    Boats, planes    Family History: Non contributory to the present illness  Social History: Social History   Socioeconomic History   Marital status: Married    Spouse name: Ellia Knowlton   Number of children: Not on file   Years of education: Not on file   Highest education level: Not on file  Occupational History   Not on file  Tobacco Use   Smoking status: Never   Smokeless tobacco: Never  Vaping Use   Vaping status: Never  Used  Substance and Sexual Activity   Alcohol use: Yes    Alcohol/week: 6.0 standard drinks of alcohol    Types: 6 Standard drinks or equivalent per week   Drug use: Never   Sexual activity: Yes    Partners: Male  Other Topics Concern   Not on file  Social History Narrative   Not on file   Social Drivers of Health   Financial Resource Strain: Not on file  Food Insecurity: Not on file  Transportation Needs: Not on file  Physical Activity: Not on file  Stress: Not on file  Social Connections: Not on file  Intimate  Partner Violence: Not on file    Vital Signs: There were no vitals taken for this visit. There is no height or weight on file to calculate BMI.   Examination: General Appearance: The patient is well-developed, well-nourished, and in no distress. Neck Circumference: 39 cm Skin: Gross inspection of skin unremarkable. Head: normocephalic, no gross deformities. Eyes: no gross deformities noted. ENT: ears appear grossly normal Neurologic: Alert and oriented. No involuntary movements.    STOP BANG RISK ASSESSMENT S (snore) Have you been told that you snore?     YES   T (tired) Are you often tired, fatigued, or sleepy during the day?   YES  O (obstruction) Do you stop breathing, choke, or gasp during sleep? NO   P (pressure) Do you have or are you being treated for high blood pressure? YES   B (BMI) Is your body index greater than 35 kg/m? NO   A (age) Are you 41 years old or older? YES   N (neck) Do you have a neck circumference greater than 16 inches?   NO   G (gender) Are you a female? NO   TOTAL STOP/BANG "YES" ANSWERS 4                                                               A STOP-Bang score of 2 or less is considered low risk, and a score of 5 or more is high risk for having either moderate or severe OSA. For people who score 3 or 4, doctors may need to perform further assessment to determine how likely they are to have OSA.         EPWORTH SLEEPINESS SCALE:  Scale:  (0)= no chance of dozing; (1)= slight chance of dozing; (2)= moderate chance of dozing; (3)= high chance of dozing  Chance  Situtation    Sitting and reading: 1    Watching TV: 1    Sitting Inactive in public: 0    As a passenger in car: 1      Lying down to rest: 1    Sitting and talking: 0    Sitting quielty after lunch: 1    In a car, stopped in traffic: 0   TOTAL SCORE:   5 out of 24    SLEEP STUDIES:  None   LABS: No results found for this or any previous visit (from  the past 2160 hours).  Radiology: DG Chest 2 View Result Date: 03/17/2024 CLINICAL DATA:  Cough for 3 weeks. Negative COVID and flu test at home. Was given prednisone  inhaler but the symptoms have worsened. Wheezing, fatigue, productive cough. EXAM: CHEST -  2 VIEW COMPARISON:  None Available. FINDINGS: Cardiac silhouette and mediastinal contours are within normal limits. The lungs are clear. No pleural effusion or pneumothorax. No acute skeletal abnormality. IMPRESSION: No active cardiopulmonary disease. Electronically Signed   By: Tanda Lyons M.D.   On: 03/17/2024 14:26    No results found.  No results found.    Assessment and Plan: Patient Active Problem List   Diagnosis Date Noted   Screening for colon cancer 10/23/2022   Polyp of sigmoid colon 10/23/2022   Cervical paraspinal muscle spasm 10/27/2021   Strain of left supraspinatus muscle 10/27/2021   Benign essential HTN 11/11/2018   Stable angina pectoris 11/11/2018   Premenstrual tension syndrome 11/10/2018   Migraine 09/28/2005   1. Suspected sleep apnea (Primary) Patient evaluation suggests high risk of sleep disordered breathing due to snoring, daytime sleepiness, headaches, brain fog, impaired concentration.   Patient has comorbid cardiovascular risk factors including: hypertension which could be exacerbated by pathologic sleep-disordered breathing.  Suggest: PSG to assess/treat the patient's sleep disordered breathing. The patient was also counselled on weight loss to optimize sleep health.   2. Hypertension, unspecified type Controlled with amlodipine  and metoprolol . Continue.       General Counseling: I have discussed the findings of the evaluation and examination with Dejanae.  I have also discussed any further diagnostic evaluation thatmay be needed or ordered today. Rielynn verbalizes understanding of the findings of todays visit. We also reviewed her medications today and discussed drug interactions and side effects  including but not limited excessive drowsiness and altered mental states. We also discussed that there is always a risk not just to her but also people around her. she has been encouraged to call the office with any questions or concerns that should arise related to todays visit.  No orders of the defined types were placed in this encounter.       I have personally obtained a history, evaluated the patient, evaluated pertinent data, formulated the assessment and plan and placed orders.   This patient was seen today by Lauraine Lay, PA-C in collaboration with Dr. Elfreda Bathe.   Elfreda DELENA Bathe, MD [facility] Diplomate ABMS Pulmonary and Critical Care Medicine Sleep medicine

## 2024-07-03 ENCOUNTER — Ambulatory Visit: Payer: Self-pay | Admitting: Internal Medicine

## 2024-07-03 VITALS — BP 140/87 | HR 58 | Resp 16 | Ht 67.0 in | Wt 166.0 lb

## 2024-07-03 DIAGNOSIS — R29818 Other symptoms and signs involving the nervous system: Secondary | ICD-10-CM | POA: Diagnosis not present

## 2024-07-03 DIAGNOSIS — I10 Essential (primary) hypertension: Secondary | ICD-10-CM | POA: Diagnosis not present

## 2024-08-11 ENCOUNTER — Telehealth: Payer: Self-pay

## 2024-08-11 NOTE — Telephone Encounter (Signed)
 Left voice mail to set up TOC medication refills.
